# Patient Record
Sex: Female | Born: 1990 | Race: White | Hispanic: No | Marital: Single | Smoking: Never smoker
Health system: Southern US, Community
[De-identification: ages and names within clinical notes are randomized; demographics above are authoritative.]

## PROBLEM LIST (undated history)

## (undated) ENCOUNTER — Emergency Department (HOSPITAL_COMMUNITY): Admission: EM | Payer: Medicaid Other

## (undated) DIAGNOSIS — J45909 Unspecified asthma, uncomplicated: Secondary | ICD-10-CM

---
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
## 2013-11-09 ENCOUNTER — Ambulatory Visit: Payer: Self-pay | Admitting: Podiatry

## 2016-03-22 NOTE — L&D Delivery Note (Signed)
Delivery Note Pt labored down while I was in another delivery. She then pushed for 30mins and at 7:20 AM a viable female was delivered via Vaginal, Spontaneous (Presentation: LOA ;  ).  APGAR: 9, 9; weight pending  .   Placenta status:delivered intact shultz , .  Cord: 3vc with the following complications:none .    Anesthesia:  Epidural Episiotomy: None Lacerations: 1st degree;Perineal Suture Repair: 2.0 vicryl Est. Blood Loss (mL): 100  Mom to postpartum.  Baby to Couplet care / Skin to Skin. Circ to be done in office  Cathrine MusterCecilia W Trameka Dorough 03/13/2017, 7:41 AM

## 2016-10-08 LAB — OB RESULTS CONSOLE HEPATITIS B SURFACE ANTIGEN: Hepatitis B Surface Ag: NEGATIVE

## 2016-10-08 LAB — OB RESULTS CONSOLE GC/CHLAMYDIA: Gonorrhea: NEGATIVE

## 2016-10-08 LAB — OB RESULTS CONSOLE RUBELLA ANTIBODY, IGM: Rubella: IMMUNE

## 2016-10-08 LAB — OB RESULTS CONSOLE VARICELLA ZOSTER ANTIBODY, IGG: Varicella: IMMUNE

## 2016-10-08 LAB — OB RESULTS CONSOLE HIV ANTIBODY (ROUTINE TESTING): HIV: NONREACTIVE

## 2016-10-11 LAB — OB RESULTS CONSOLE ABO/RH: "RH Type ": NEGATIVE

## 2016-10-11 LAB — OB RESULTS CONSOLE GC/CHLAMYDIA: Chlamydia: NEGATIVE

## 2016-10-11 LAB — OB RESULTS CONSOLE ANTIBODY SCREEN
Antibody Screen: NEGATIVE
Antibody Screen: NEGATIVE

## 2016-12-28 ENCOUNTER — Ambulatory Visit: Payer: Medicaid Other | Attending: Obstetrics and Gynecology | Admitting: Physical Therapy

## 2016-12-28 ENCOUNTER — Encounter: Payer: Self-pay | Admitting: Physical Therapy

## 2016-12-28 DIAGNOSIS — O26892 Other specified pregnancy related conditions, second trimester: Secondary | ICD-10-CM

## 2016-12-28 DIAGNOSIS — R102 Pelvic and perineal pain: Secondary | ICD-10-CM | POA: Insufficient documentation

## 2016-12-28 DIAGNOSIS — M545 Low back pain, unspecified: Secondary | ICD-10-CM

## 2016-12-28 NOTE — Therapy (Addendum)
[place], [place], [address] Phone: [phone]   Fax:  [phone]  Physical Therapy Evaluation      Addended Discharge  Patient Details  Name: Sonya Jackson MRN: [id number] Date of Birth: [birth year] Referring Provider: Dr. Crawford Givens  Encounter Date: 12/28/2016      PT End of Session - 12/28/16 1618    Visit Number 1   Number of Visits 4   Date for PT Re-Evaluation 01/25/17   PT Start Time 0758   PT Stop Time 0845   PT Time Calculation (min) 47 min   Activity Tolerance Patient tolerated treatment well   Behavior During Therapy [facility] for tasks assessed/performed      History reviewed. No pertinent past medical history.  History reviewed. No pertinent surgical history.  There were no vitals filed for this visit.       Subjective Assessment - 12/28/16 0803    Subjective Patient is 6 mos pregnant and has low back/pelvic pain. She reports pain in her back prior to pregnancy ("pinched nerve"). She now has pelvic pain, difficulty lifting her leg in a standing and sitting position.   Pain with transitional movements. No red flags.  She denies back pain currently.  She has pain with walking, feels like her "bones are being pulled apart"   No tingling in her legs or perineals.  No weakness.  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    Pertinent History low back pain    Limitations Sitting;Lifting;Standing;Walking;House hold activities;Other (comment)  dancing, teaching, sleeping    How long can you sit comfortably? not long at all    How long can you stand comfortably? not ever comfortable   How long can you walk comfortably? not long    Diagnostic tests None    Patient Stated Goals Pt would like to not hurt as bad.    Currently in Pain? Yes   Pain Score 7    Pain Location Pelvis   Pain Orientation Anterior   Pain Descriptors / Indicators  Burning;Pressure   Pain Type Chronic pain   Pain Radiating Towards no   Pain Onset More than a month ago   Pain Frequency Intermittent   Aggravating Factors  hip flexion in standing and sitting.     Pain Relieving Factors rest, laying flat, ice    Effect of Pain on Daily Activities pain limits comfort and activity, sleep   Multiple Pain Sites No            [facility] PT Assessment - 12/28/16 0001      Assessment   Medical Diagnosis low back pain    Referring Provider Dr. 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 , has a Insurance underwriter   Overall  Cognitive Status Within Functional Limits for tasks assessed     Observation/Other Assessments   Focus on Therapeutic Outcomes (FOTO)  NT      Sensation   Light Touch Appears Intact     Functional Tests   Functional tests Single leg stance     Single Leg Stance   Comments painful but able to do >15 sec      Posture/Postural Control   Posture/Postural Control Postural limitations   Postural Limitations Anterior pelvic tilt     AROM   Overall AROM Comments Lumbar/trunk WFL , hip flexion WFL, pain with active ER and IR of hips     PROM   Overall PROM Comments pain with passive hip ER and IR      Strength   Overall Strength Comments WFL in bilateral hips      Flexibility   Hamstrings WNL 80 deg mild pain in lumbar bilaterally.      Palpation   SI assessment  pain anteriorly with SIJ compression in sidelying    Palpation comment  landmarks level pubic ramus and ASIS, PSIS     Transfers   Comments pain with supine to sit anf sit to supine      Ambulation/Gait   Ambulation Distance (Feet) 150 Feet   Assistive device Other (Comment)   Gait Pattern Within Functional Limits  mild antalgic gait             Objective measurements completed on examination: See above findings.          [facility] Adult PT Treatment/Exercise - 12/28/16 0001      Self-Care   Self-Care Posture;Heat/Ice Application;Other Self-Care Comments   Posture sitting on therapy ball,    Other Self-Care Comments  pregnancy hormones, anatomy of pelvis, HEP and core , body mechanics      Lumbar Exercises: Seated   Other Seated Lumbar Exercises Transverse Abdominus x 10      Lumbar Exercises: Quadruped   Madcat/Old Horse 10 reps   Other Quadruped Lumbar Exercises childs pose wide legs, lateral flexion ("wagging")                   PT Short Term Goals - 12/28/16 1541      PT SHORT TERM GOAL #1   Title Pt will be I with HEP for spinal mobility and pelvic floor/core.    Baseline unknown, given on eval    Time 4   Period Weeks  3 visits   Status New   Target Date 01/25/17     PT SHORT TERM GOAL #2   Title Pt will be able to sit and lift leg for lower body dressing with min discomfort    Baseline mod to severe pain    Time 4  3 visits   Period Weeks   Target Date 01/25/17     PT SHORT TERM GOAL #3   Title Pt will be able to walk with 25% less pain, difficulty.    Baseline pain usually 6/10-8/10    Time 4  3 visit    Period Weeks   Status New   Target Date 01/25/17     PT SHORT TERM GOAL #4   Title Pt will be I with modifications of ADLs to reduce pain in pelvis, low back , use good body mechanics consistently.    Baseline needs cues, reinforced avoiding aggravating factors, lifting properly.    Time 4  3 visits    Period Weeks   Status  New   Target Date 01/25/17           PT Long Term Goals - 12/28/16  1538      PT LONG TERM GOAL #1   Title TBA if progress made                Plan - 12/28/16 1547    Clinical Impression Statement Patient presents with pelvic floor pain and a history of low back pain, chronic.  She has pelvic pain which limits simple ADLs.  I have contacted our pelvic pain specialist as I believe she may have the need for manual therapy.  Bony landmarks seemed symmetrical to me on eval. She was given quadruped exercises and pelvic floor isometrics.  She may benefit from a SI belt as well and she had already ordered it.    Clinical Presentation Stable   Clinical Presentation due to: increasing pain    Clinical Decision Making Low   Rehab Potential Excellent   PT Frequency 1x / week   PT Duration 4 weeks  3 visits    PT Treatment/Interventions ADLs/Self Care Home Management;Neuromuscular re-education;Manual techniques;Therapeutic exercise;Cryotherapy;Therapeutic activities;Functional mobility training;Patient/family education;Moist Heat   PT Next Visit Plan ?Refer to pelvic pain specialist? review HEP and pelvic /spinal mobility, SI belt? tape    PT Home Exercise Plan quadruped, iso TrA    Consulted and Agree with Plan of Care Patient      Patient will benefit from skilled therapeutic intervention in order to improve the following deficits and impairments:  Decreased range of motion, Decreased mobility, Postural dysfunction, Pain, Improper body mechanics, Increased fascial restricitons  Visit Diagnosis: Pelvic pain affecting pregnancy in second trimester, antepartum  Bilateral low back pain without sciatica, unspecified chronicity     Problem List There are no active problems to display for this patient.   Katonya Blecher 12/28/2016, 4:28 PM  [facility] [address] [place], [place], [address] Phone: [phone]   Fax:  [phone]  Name: Sonya Jackson MRN: [id number] Date of Birth: [birth year]    Raeford Razor, PT 12/28/16 4:29 PM Phone: [phone] Fax: [phone]   PHYSICAL THERAPY DISCHARGE SUMMARY  Visits from Start of Care: 1  Current functional level related to goals / functional outcomes: Unknown, see above    Remaining deficits: Unknown   Education / Equipment: HEP, pelvic floor pain specialist  Plan: Patient agrees to discharge.  Patient goals were not met. Patient is being discharged due to the patient's request.  ?????    Referred out to pelvic pain specialist , appt pending.   Raeford Razor, PT 01/26/17 9:59 AM Phone: [phone] Fax: [phone]

## 2017-01-11 ENCOUNTER — Ambulatory Visit: Payer: Medicaid Other | Admitting: Physical Therapy

## 2017-01-24 ENCOUNTER — Ambulatory Visit: Payer: Medicaid Other | Admitting: Physical Therapy

## 2017-02-01 ENCOUNTER — Ambulatory Visit: Payer: Medicaid Other | Attending: Obstetrics and Gynecology | Admitting: Physical Therapy

## 2017-02-01 ENCOUNTER — Encounter: Payer: Self-pay | Admitting: Physical Therapy

## 2017-02-01 DIAGNOSIS — O26892 Other specified pregnancy related conditions, second trimester: Secondary | ICD-10-CM

## 2017-02-01 DIAGNOSIS — R102 Pelvic and perineal pain: Secondary | ICD-10-CM | POA: Insufficient documentation

## 2017-02-01 DIAGNOSIS — M545 Low back pain, unspecified: Secondary | ICD-10-CM

## 2017-02-01 NOTE — Therapy (Signed)
[facility] [address], [address] [place], [place], [address] Phone: [phone]   Fax:  [phone]  Physical Therapy Treatment  Patient Details  Name: Sonya Jackson MRN: [id number] Date of Birth: 1990-04-01 Referring Provider: Dr. Doristine Counter   Encounter Date: 02/01/2017  PT End of Session - 02/01/17 1438    Visit Number  2    Date for PT Re-Evaluation  03/10/17    Authorization Type  Medicaid 8 visits from 01/11/2017-02/10/2017    Authorization - Visit Number  2    Authorization - Number of Visits  8    PT Start Time  1145    PT Stop Time  1225    PT Time Calculation (min)  40 min    Activity Tolerance  Patient tolerated treatment well    Behavior During Therapy  [facility] for tasks assessed/performed       History reviewed. No pertinent past medical history.  History reviewed. No pertinent surgical history.  There were no vitals filed for this visit.  Subjective Assessment - 02/01/17 1150    Subjective  Patient is 6 mos pregnant and has low back/pelvic pain. She reports pain in her back prior to pregnancy ("pinched nerve"). She now has pelvic pain, difficulty lifting her leg in a standing and sitting position.   Pain with transitional movements. No red flags.  She denies back pain currently.  She has pain with walking, feels like her "bones are being pulled apart"   No tingling in her legs or perineals.  No weakness.  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     Pertinent History  low back pain     Limitations  Sitting;Lifting;Standing;Walking;House hold activities;Other (comment)    How long can you sit comfortably?  not long at all     How long can you stand comfortably?  not ever comfortable    How long can you walk comfortably?  not long     Diagnostic tests  None     Patient Stated Goals  Pt would like to not hurt as bad.     Currently in Pain?  Yes    Pain  Score  8     Pain Location  Pelvis    Pain Orientation  Anterior    Pain Descriptors / Indicators  Burning;Pressure    Pain Type  Chronic pain    Pain Onset  More than a month ago    Pain Frequency  Intermittent    Aggravating Factors   lay down and turn self over, hip flexion in standing and sitting    Pain Relieving Factors  rest, laying flat, ice    Multiple Pain Sites  No         [facility] PT Assessment - 02/01/17 0001      Assessment   Medical Diagnosis  low back pain     Onset Date/Surgical Date  -- 6 mos    Next MD Visit  no     Prior Therapy  No       Precautions   Precautions  Other (comment)    Precaution Comments  no biofreeze, [redacted] weeks pregnant      Restrictions   Weight Bearing Restrictions  No      Balance Screen   Has the patient fallen in the past 6 months  No      Home Environment   Living Environment  Private  residence    Living Arrangements  Parent    Home Access  Level entry    Home Layout  One level    Additional Comments  lives with parents,engaged      Prior Function   Vocation  Part time employment    Therapist, sportsVocation Requirements  dance teacher , has a Administrator, 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       Cognition   Overall Cognitive Status  Within Functional Limits for tasks assessed      Observation/Other Assessments   Focus on Therapeutic Outcomes (FOTO)   NT       Sensation   Light Touch  Appears Intact      Functional Tests   Functional tests  Single leg stance      Single Leg Stance   Comments  painful but able to do >15 sec       Posture/Postural Control   Posture/Postural Control  Postural limitations    Postural Limitations  Anterior pelvic tilt      Palpation   SI assessment   pain anteriorly with SIJ compression in sidelying                   [facility] Adult PT Treatment/Exercise - 02/01/17 0001      Self-Care   Self-Care  Other Self-Care Comments    Other Self-Care Comments   brace to use to assist in pelvic pressure      Therapeutic Activites     Therapeutic Activities  ADL's    ADL's  rolling bed with log roll and pelvic floor contraction, laying in bed with pillows, breathing out during strenous activity      Lumbar Exercises: Quadruped   Madcat/Old Horse  10 reps      Manual Therapy   Manual Therapy  Muscle Energy Technique    Muscle Energy Technique  correct right ilium in sidely             PT Education - 02/01/17 1219    Education provided  Yes    Education Details  how to use ball to massage pelvic floor muscles    Person(s) Educated  Patient    Methods  Explanation;Demonstration;Verbal cues;Handout    Comprehension  Verbalized understanding;Returned demonstration       PT Short Term Goals - 02/01/17 1237      PT SHORT TERM GOAL #1   Title  Pt will be I with HEP for spinal mobility and pelvic floor/core.     Baseline  unknown, given on eval     Time  4    Period  Weeks 3 visits    Status  On-going      PT SHORT TERM GOAL #2   Title  Pt will be able to sit and lift leg for lower body dressing with min discomfort     Time  4 3 visits    Period  Weeks    Status  New      PT SHORT TERM GOAL #3   Title  Pt will be able to walk with 25% less pain, difficulty.     Baseline  pain usually 6/10-8/10     Time  4 3 visits    Period  Weeks    Status  New      PT SHORT TERM GOAL #4   Title  Pt will be I with modifications of ADLs to reduce pain in pelvis, low back , use good body mechanics consistently.     Baseline  needs  cues, reinforced avoiding aggravating factors, lifting properly.     Time  4 3 visits    Period  Weeks    Status  New        PT Long Term Goals - 02/01/17 1318      PT LONG TERM GOAL #1   Title  Pt will be able to sit and lift leg for lower body dressing with min discomfort     Baseline  pain level 6-8/10    Time  4    Period  Weeks    Status  New    Target Date  03/10/17      PT LONG TERM GOAL #2   Title  turn in bed with minimal pain due to abdominal bracing and correct body  mechanics    Baseline  pain level 6-8/10    Time  4    Period  Weeks    Status  New    Target Date  03/10/17      PT LONG TERM GOAL #3   Title  pelvic pressure reduced  minimal due to increased tone of pelvic floor muscles and using a pregnancy brace    Baseline  moderate to max depending on position    Time  4    Period  Weeks    Status  New    Target Date  03/10/17            Plan - 02/01/17 1439    Clinical Impression Statement  Patient has only attended the initial evaluation due to her schedule.  Patient continues to have pelvic pressure with standing, walking, daily activities.  She has 8/10 intermittent back pain.  Patient right ilium is anteriorly rotated.  Patient has difficulty with moving in bed and going to sit to stand with increased pain and pressure of the back and perineum.  Patient is having difficulty with her blood pressure and being monitored weekly.  Patient was instructed on SI belt that will support the perineum to reduce the pelvic pressure.  Patient understands how to contract the pelvic floor muscles to work on the support and reduce pressure. Patient will benefit from skilled therapy to reduce pelvic pressure and decrease pain so she is able to perform daily activities in her last trimester of her pregnancy.     Rehab Potential  Excellent    Clinical Impairments Affecting Rehab Potential  [redacted] weeks pregnant    PT Frequency  1x / week    PT Duration  4 weeks    PT Treatment/Interventions  ADLs/Self Care Home Management;Neuromuscular re-education;Manual techniques;Therapeutic exercise;Cryotherapy;Therapeutic activities;Functional mobility training;Patient/family education;Moist Heat    PT Next Visit Plan  soft tissue work to perineum, lumbar soft tissue work, correct pelvis, core strength, ask about the belt    PT Home Exercise Plan  progress as needed    Recommended Other Services  renewal sent    Consulted and Agree with Plan of Care  Patient        Patient will benefit from skilled therapeutic intervention in order to improve the following deficits and impairments:  Decreased range of motion, Decreased mobility, Postural dysfunction, Pain, Improper body mechanics, Increased fascial restricitons  Visit Diagnosis: Pelvic pain affecting pregnancy in second trimester, antepartum - Plan: PT plan of care cert/re-cert  Bilateral low back pain without sciatica, unspecified chronicity - Plan: PT plan of care cert/re-cert     Problem List There are no active problems to display for this patient.   Eulis Fosterheryl Jacinto Keil,  PT 02/01/17 2:48 PM   [facility] [address], [address] [place], [place], [address] Phone: [phone]   Fax:  [phone]  Name: MECKENZIE BALSLEY MRN: [id number] Date of Birth: 17-Apr-1990

## 2017-02-01 NOTE — Patient Instructions (Addendum)
Slow Contraction: Gravity Eliminated (Side-Lying)    Lie on left side, hips and knees slightly bent. Slowly squeeze pelvic floor for _5__ seconds. Rest for _10__ seconds. Repeat _10__ times. Do _3__ times a day.   Copyright  VHI. All rights reserved.   Quick Contraction: Gravity Eliminated (Side-Lying)    Lie on left side, hips and knees slightly bent. Quickly squeeze then fully relax pelvic floor. Perform __1_ sets of __5_. Rest for __1_ seconds between sets. Do _3__ times a day.   Copyright  VHI. All rights reserved.  PELVIC PRESSURE: Bound Angle    Sit with soles of feet together. Hold feet or ankles. Press knees toward floor and stretch up through spine. Lean forward and Hold position for __5_ breaths. Repeat _5__ times. Do _2__ times per day.  Copyright  VHI. All rights reserved.  PELVIC PRESSURE: Spinal Twist (Sitting)    Place right foot over left leg. Hold right foot with right hand, place left hand behind buttocks. Inhaling, straighten spine. Exhaling, rotate torso to left. Hold position for _10__ breaths. Switch legs and repeat on other side. Repeat _1__ times, each side. Do __1_ times per day.  Copyright  VHI. All rights reserved.    BACK: Stretch on [employer] CorporationWall    Lean against wall with feet about _6__ inches away from wall. Keep knees straight or slightly bent. Exhaling, bring chin toward chest and slowly peel your back off of the wall. Inhaling, tighten abdomen and slowly bring upper body back onto wall. Repeat _2__ times. Do _2__ times per day.  Copyright  VHI. All rights reserved.  BACK / HAMSTRING: Stretch on Wall    Place hands shoulder-width apart on wall and move hips backward until hips, shoulders, and hands align. Exhaling, contract quadriceps and lean hips away from wall. Bend knees if discomfort is felt in low back. Hold position for _10__ breaths. Tighten abdomen and roll up to standing. Repeat _3__ times. Do __2_ times per day.  Copyright   VHI. All rights reserved.  Kegel    Exhaling, squeeze pelvic floor muscles as if to hold something in the vaginal opening. Inhaling, release. Can be done sitting, standing, side-lying, or knee-chest. Practice in a position that is comfortable and switch positions for variety. Repeat __10_ times. Do __3_ times per day.  Copyright  VHI. All rights reserved.  [facility] [address], [address] [place], [place] [address] Phone # [phone] Fax [phone]

## 2017-02-08 ENCOUNTER — Ambulatory Visit: Payer: Medicaid Other | Admitting: Physical Therapy

## 2017-02-08 ENCOUNTER — Encounter: Payer: Self-pay | Admitting: Physical Therapy

## 2017-02-08 ENCOUNTER — Encounter: Payer: Medicaid Other | Admitting: Physical Therapy

## 2017-02-08 DIAGNOSIS — O26892 Other specified pregnancy related conditions, second trimester: Secondary | ICD-10-CM

## 2017-02-08 DIAGNOSIS — R102 Pelvic and perineal pain: Principal | ICD-10-CM

## 2017-02-08 DIAGNOSIS — M545 Low back pain, unspecified: Secondary | ICD-10-CM

## 2017-02-08 NOTE — Patient Instructions (Addendum)
Isometric Hold (Hook-Lying)    Lie with hips and knees bent. Slowly inhale, and then exhale. Pull navel toward spine and Hold for __5_ seconds. Continue to breathe in and out during hold. Rest for __5_ seconds. Repeat __5_ times. Do _2__ times a day. Do in sidely.   Do after the baby if vaginal birth. ( on back) Copyright  VHI. All rights reserved.   Clam Shell 45 Degrees    Lying with hips and knees bent 45, one pillow between knees and ankles. Lift knee. Be sure pelvis does not roll backward. Do not arch back. Do _10__ times, each leg, _1__ times per day. Both sides.   http://ss.exer.us/75   Copyright  VHI. All rights reserved.    BACK: Child's Pose (Sciatica)    Sit in knee-chest position and reach arms forward. Separate knees for comfort. Hold position for _30__ breaths. Repeat __2_ times. Do __1_ times per day.  Copyright  VHI. All rights reserved.  BACK: Rounded Cat Stretch    Exhaling, pull abdomen toward spine, tilt hips forward, and bring chin toward chest rounding the spine. Hold position for _1__ breaths. Inhaling, straighten back. Repeat __15_ times. Do _1__ times per day.  Copyright  VHI. All rights reserved.  PELVIC PRESSURE / LOW BACK: Swan    With hands on floor, shoulder-width apart, bring right knee forward so that knee is facing to the right and sole of right foot is facing left. Extend left leg back. Tighten abdominal muscles to stabilize the spine. Hold position for _30__ breaths. Exhaling, switch legs and repeat. Repeat _2__ times, each side. Do __1_ times per day.  Copyright  VHI. All rights reserved.  Adduction: Hip - Knees Together With Pelvic Floor (Sitting)    Sit with towel roll between knees. Squeeze pelvic floor while pushing knees together. Hold for ___ seconds. Rest for _10__ seconds. Repeat _5__ times. Do _2-3__ times a day.  Copyright  VHI. All rights reserved.  [facility] [address], [address] [place], [place] [address] Phone # [phone] Fax [phone]

## 2017-02-08 NOTE — Therapy (Signed)
[facility] [address] [place], [place], [address] Phone: [phone]   Fax:  [phone]  Physical Therapy Treatment  Patient Details  Name: Sonya Jackson MRN: [id number] Date of Birth: 1990/09/19 Referring Provider: Dr. Crawford Givens   Encounter Date: 02/08/2017  PT End of Session - 02/08/17 0934    Visit Number  3    Date for PT Re-Evaluation  03/12/17    Authorization Type  Medicaid 02/11/2017-03/12/2017 3 visits     Authorization - Visit Number  3    Authorization - Number of Visits  6    PT Start Time  0930    PT Stop Time  1010    PT Time Calculation (min)  40 min    Activity Tolerance  Patient tolerated treatment well    Behavior During Therapy  [facility] for tasks assessed/performed       History reviewed. No pertinent past medical history.  History reviewed. No pertinent surgical history.  There were no vitals filed for this visit.  Subjective Assessment - 02/08/17 0936    Subjective  No changes since last visit. I feel like I pinched something in my back.  I have trouble moving my left leg. I ordered the SI belt.     Pertinent History  low back pain     Limitations  Sitting;Lifting;Standing;Walking;House hold activities;Other (comment)    How long can you sit comfortably?  not long at all     How long can you stand comfortably?  not ever comfortable    How long can you walk comfortably?  not long     Diagnostic tests  None     Patient Stated Goals  Pt would like to not hurt as bad.     Currently in Pain?  Yes    Pain Score  5     Pain Location  Pelvis    Pain Orientation  Anterior    Pain Descriptors / Indicators  Burning;Pressure    Pain Type  Chronic pain    Pain Onset  More than a month ago    Pain Frequency  Intermittent    Aggravating Factors   lay down and turn self over, hip flexion in standing and sitting    Pain Relieving Factors  rest, laying flat, ice         [facility] PT  Assessment - 02/08/17 0001      Palpation   SI assessment   left ilium is rotated posteriorly                  [facility] Adult PT Treatment/Exercise - 02/08/17 0001      Therapeutic Activites    Therapeutic Activities  Other Therapeutic Activities    Other Therapeutic Activities  rolling in bed, sit to stand and supine to sit with abdominal bracing and pelvic floor contraction      Lumbar Exercises: Seated   Other Seated Lumbar Exercises  sitting hip adduction with pillow and pelvic floor contration      Manual Therapy   Manual Therapy  Muscle Energy Technique;Joint mobilization;Soft tissue mobilization    Joint Mobilization  gapping of the left facet of L4-S1    Soft tissue mobilization  to bil. coccygeus in quadruped going into child pose    Muscle Energy Technique  correct left ilium             PT Education - 02/08/17 1015    Education provided  Yes  Education Details  abdominal bracing with pelvic floor contraction in sitting and sidely and supine, transitional movements with abdominal bracing and pelvic floor contraciton, stretches    Person(s) Educated  Patient    Methods  Explanation;Demonstration;Verbal cues;Handout    Comprehension  Verbalized understanding;Returned demonstration       PT Short Term Goals - 02/01/17 1237      PT SHORT TERM GOAL #1   Title  Pt will be I with HEP for spinal mobility and pelvic floor/core.     Baseline  unknown, given on eval     Time  4    Period  Weeks 3 visits    Status  On-going      PT SHORT TERM GOAL #2   Title  Pt will be able to sit and lift leg for lower body dressing with min discomfort     Time  4 3 visits    Period  Weeks    Status  New      PT SHORT TERM GOAL #3   Title  Pt will be able to walk with 25% less pain, difficulty.     Baseline  pain usually 6/10-8/10     Time  4 3 visits    Period  Weeks    Status  New      PT SHORT TERM GOAL #4   Title  Pt will be I with modifications of ADLs to  reduce pain in pelvis, low back , use good body mechanics consistently.     Baseline  needs cues, reinforced avoiding aggravating factors, lifting properly.     Time  4 3 visits    Period  Weeks    Status  New        PT Long Term Goals - 02/01/17 1318      PT LONG TERM GOAL #1   Title  Pt will be able to sit and lift leg for lower body dressing with min discomfort     Baseline  pain level 6-8/10    Time  4    Period  Weeks    Status  New    Target Date  03/10/17      PT LONG TERM GOAL #2   Title  turn in bed with minimal pain due to abdominal bracing and correct body mechanics    Baseline  pain level 6-8/10    Time  4    Period  Weeks    Status  New    Target Date  03/10/17      PT LONG TERM GOAL #3   Title  pelvic pressure reduced  minimal due to increased tone of pelvic floor muscles and using a pregnancy brace    Baseline  moderate to max depending on position    Time  4    Period  Weeks    Status  New    Target Date  03/10/17            Plan - 02/08/17 1017    Clinical Impression Statement  After therapy patient pelvis was in correct alignment and reduction in pelvic pressure. Patient pelvis in correct alignment.  Her L5-S1 facet was tight and after therapy had full movement.  Patient had trigger points in the right coccygeus making pain  going from sit to stand and after therapy no pain.  Patient has bough a SI belt and is waiting for it to come in. Patient has not met goals due to just having 3  visits.  Patient will benefit from skilled therapy to decrease pain and pelvic pressure so she is able to perform daily activities in her last trimester of her pregnanacy.     Rehab Potential  Excellent    Clinical Impairments Affecting Rehab Potential  [redacted] weeks pregnant    PT Frequency  1x / week    PT Duration  4 weeks    PT Treatment/Interventions  ADLs/Self Care Home Management;Neuromuscular re-education;Manual techniques;Therapeutic exercise;Cryotherapy;Therapeutic  activities;Functional mobility training;Patient/family education;Moist Heat    PT Next Visit Plan  soft tissue work to perineum, lumbar soft tissue work, correct pelvis, core strength, ask about the belt    PT Home Exercise Plan  progress as needed    Recommended Other Services  MD signed renewal note    Consulted and Agree with Plan of Care  Patient       Patient will benefit from skilled therapeutic intervention in order to improve the following deficits and impairments:  Decreased range of motion, Decreased mobility, Postural dysfunction, Pain, Improper body mechanics, Increased fascial restricitons  Visit Diagnosis: Pelvic pain affecting pregnancy in second trimester, antepartum  Bilateral low back pain without sciatica, unspecified chronicity     Problem List There are no active problems to display for this patient.   Earlie Counts, PT 02/08/17 10:21 AM   [facility] [address] [place], [place], [address] Phone: [phone]   Fax:  [phone]  Name: Sonya Jackson MRN: [id number] Date of Birth: [birth year]

## 2017-02-22 ENCOUNTER — Encounter: Payer: Medicaid Other | Admitting: Physical Therapy

## 2017-03-01 ENCOUNTER — Encounter: Payer: Medicaid Other | Admitting: Physical Therapy

## 2017-03-04 LAB — OB RESULTS CONSOLE GBS: STREP GROUP B AG: NEGATIVE

## 2017-03-08 ENCOUNTER — Ambulatory Visit: Payer: Medicaid Other | Attending: Obstetrics and Gynecology | Admitting: Physical Therapy

## 2017-03-08 ENCOUNTER — Encounter: Payer: Self-pay | Admitting: Physical Therapy

## 2017-03-08 DIAGNOSIS — R102 Pelvic and perineal pain: Secondary | ICD-10-CM | POA: Diagnosis present

## 2017-03-08 DIAGNOSIS — M545 Low back pain, unspecified: Secondary | ICD-10-CM

## 2017-03-08 DIAGNOSIS — O26892 Other specified pregnancy related conditions, second trimester: Secondary | ICD-10-CM

## 2017-03-08 NOTE — Patient Instructions (Addendum)
Wall Lean Stretch    With right elbow against wall, slowly stretch hips toward wall, other arm supporting trunk. Hold _till pain in center of back___ seconds. Relax. Repeat __1__ times per set. Do __1__ sets per session. Do ___3_ sessions per day. Should be bringing pain to the center of you back http://orth.exer.us/102   Copyright  VHI. All rights reserved.    Cryotherapy WHAT IS CRYOTHERAPY? Cryotherapy, or cold therapy, is a treatment that uses cold temperatures to treat an injury or medical condition. It includes using cold packs or ice packs to reduce pain and swelling. Only use cryotherapy if your doctor says it is okay. HOW DO I USE CRYOTHERAPY?  Place a towel between the cold source and your skin.  Apply the cold source for no more than 20 minutes at a time.  Check your skin after 5 minutes to make sure there are no signs of a poor response to cold or skin damage. Check for: ? White spots on your skin. Your skin may look blotchy or mottled. ? Skin that looks blue or pale. ? Skin that feels waxy or hard.  Repeat these steps as many times each day as told by your doctor.  HOW CAN I MAKE A COLD PACK? When using a cold pack at home to reduce pain and swelling, you can use:  A silica gel cold pack that has been left in the freezer. You can buy this online or in stores.  A plastic bag of frozen vegetables.  A sealable plastic bag that has been filled with crushed ice.  Always wrap the pack in a dry or damp towel to avoid direct contact with your skin. WHEN SHOULD I CALL MY DOCTOR? Call your doctor if:  You start to have white spots on your skin. This may give your skin a blotchy or mottled look.  Your skin turns blue or pale.  Your skin becomes waxy or hard.  Your swelling gets worse.  This information is not intended to replace advice given to you by your health care provider. Make sure you discuss any questions you have with your health care provider. Document  Released: 08/25/2007 Document Revised: 08/14/2015 Document Reviewed: 11/20/2014 Elsevier Interactive Patient Education  2017 Elsevier Inc. Piriformis Stretch, Sitting    Sit, one ankle on opposite knee, same-side hand on crossed knee. Push down on knee, keeping spine straight. Lean torso forward, with flat back, until tension is felt in hamstrings and gluteals of crossed-leg side. Hold _30__ seconds.  Repeat _2__ times per session. Do __1_ sessions per day.  Copyright  VHI. All rights reserved.  Use ball during birth  [facility] [address], [address] [place], [place] [address] Phone # [phone] Fax [phone]

## 2017-03-08 NOTE — Therapy (Signed)
[facility] [address], [place], [place], [address] Phone: [phone]   Fax:  [phone]  Physical Therapy Treatment  Patient Details  Name: Sonya Jackson MRN: [id number] Date of Birth: [birth year] Referring Provider: Dr. Crawford Givens   Encounter Date: 03/08/2017  PT End of Session - 03/08/17 1010    Visit Number  4    Date for PT Re-Evaluation  03/12/17    Authorization Type  Medicaid 02/11/2017-03/12/2017 3 visits     Authorization - Visit Number  4    Authorization - Number of Visits  6    PT Start Time  0930    PT Stop Time  1008    PT Time Calculation (min)  38 min    Activity Tolerance  Patient tolerated treatment well    Behavior During Therapy  [facility] for tasks assessed/performed       History reviewed. No pertinent past medical history.  History reviewed. No pertinent surgical history.  There were no vitals filed for this visit.  Subjective Assessment - 03/08/17 0936    Subjective  I will be 37 weeks on Saturday. Patient reports her pelvic pain decreased by 25%. I have increased pain with being on my feet alot.     Pertinent History  low back pain     Limitations  Sitting;Lifting;Standing;Walking;House hold activities;Other (comment)    How long can you sit comfortably?  not long at all     How long can you stand comfortably?  not ever comfortable    How long can you walk comfortably?  not long     Diagnostic tests  None     Patient Stated Goals  Pt would like to not hurt as bad.     Currently in Pain?  Yes    Pain Location  Pelvis    Pain Orientation  Anterior    Pain Descriptors / Indicators  Burning;Pressure    Pain Type  Chronic pain    Pain Onset  More than a month ago    Pain Frequency  Intermittent    Aggravating Factors   being on feet, bridging    Pain Relieving Factors  being off her feet    Multiple Pain Sites  Yes    Pain Score  6    Pain Location  Back    Pain Orientation   Lower;Left    Pain Descriptors / Indicators  -- pinched nerve    Pain Type  Acute pain    Pain Onset  More than a month ago    Pain Frequency  Intermittent    Aggravating Factors   sitting a long period and gets up then has trouble moving left leg    Pain Relieving Factors  unable to          [facility] PT Assessment - 03/08/17 0001      Assessment   Medical Diagnosis  low back pain     Referring Provider  Dr. Crawford Givens    Onset Date/Surgical Date  -- 6 mos    Next MD Visit  no     Prior Therapy  No       Precautions   Precautions  Other (comment)    Precaution Comments  no biofreeze, [redacted] weeks pregnant      Restrictions   Weight Bearing Restrictions  No      Home Environment   Living Environment  Private residence    Living Arrangements  Parent  Home Access  Level entry    Home Layout  One level    Additional Comments  lives with parents,engaged      Prior Function   Vocation  Part time employment    Air cabin crew , has a Publishing rights manager   Overall Cognitive Status  Within Functional Limits for tasks assessed      Observation/Other Assessments   Focus on Therapeutic Outcomes (FOTO)   NT       Sensation   Light Touch  Appears Intact      Functional Tests   Functional tests  Single leg stance      Single Leg Stance   Comments  painful but able to do >15 sec       Posture/Postural Control   Posture/Postural Control  Postural limitations    Postural Limitations  Anterior pelvic tilt pregnant posture      Palpation   SI assessment   pelvis in correct alignment                  [facility] Adult PT Treatment/Exercise - 03/08/17 0001      Self-Care   Self-Care  Other Self-Care Comments    Other Self-Care Comments   using a tennis ball to massage left lower back      Exercises   Exercises  Other Exercises    Other Exercises   ways to use physioball during labor to reduce back pain and  make her comfortable      Manual  Therapy   Manual Therapy  Soft tissue mobilization;Joint mobilization    Joint Mobilization  gapping of left L3-S1 in sidely    Soft tissue mobilization  left lumbar paraspinals             PT Education - 03/08/17 1009    Education provided  Yes    Education Details  stretches; ways to massage her back; ways to use physioball during birth    Person(s) Educated  Patient    Methods  Explanation;Handout;Demonstration    Comprehension  Verbalized understanding;Returned demonstration       PT Short Term Goals - 03/08/17 1014      PT SHORT TERM GOAL #1   Title  Pt will be I with HEP for spinal mobility and pelvic floor/core.     Time  4    Period  Weeks    Status  Achieved      PT SHORT TERM GOAL #2   Title  Pt will be able to sit and lift leg for lower body dressing with min discomfort     Time  4    Period  Weeks    Status  Achieved      PT SHORT TERM GOAL #3   Title  Pt will be able to walk with 25% less pain, difficulty.     Time  4    Period  Weeks    Status  Achieved      PT SHORT TERM GOAL #4   Title  Pt will be I with modifications of ADLs to reduce pain in pelvis, low back , use good body mechanics consistently.     Time  4    Period  Weeks    Status  Achieved        PT Long Term Goals - 03/08/17 1014      PT LONG TERM GOAL #1   Title  Pt will be  able to sit and lift leg for lower body dressing with min discomfort     Time  4    Period  Weeks    Status  Achieved      PT LONG TERM GOAL #2   Title  turn in bed with minimal pain due to abdominal bracing and correct body mechanics    Time  4    Period  Weeks    Status  Achieved      PT LONG TERM GOAL #3   Title  pelvic pressure reduced  minimal due to increased tone of pelvic floor muscles and using a pregnancy brace    Time  4    Period  Weeks    Status  Achieved            Plan - 03/08/17 1011    Clinical Impression Statement  Patient understands how to use the physioball to reduce back  pain during labor.  Patient had full mobility of lumbar spine and no pain after manual techniques.  Patient is [redacted] weeks pregnant.  Patient has increased pelvic pain when she stands too long or dances so she manages it with alternate sit and stand.  Patient has met goals and ready for discharge.     Rehab Potential  Excellent    Clinical Impairments Affecting Rehab Potential  [redacted] weeks pregnant    PT Treatment/Interventions  ADLs/Self Care Home Management;Neuromuscular re-education;Manual techniques;Therapeutic exercise;Cryotherapy;Therapeutic activities;Functional mobility training;Patient/family education;Moist Heat    PT Next Visit Plan  Discharge to HEP this visit    PT Home Exercise Plan  Current HEP    Consulted and Agree with Plan of Care  Patient       Patient will benefit from skilled therapeutic intervention in order to improve the following deficits and impairments:  Decreased range of motion, Decreased mobility, Postural dysfunction, Pain, Improper body mechanics, Increased fascial restricitons  Visit Diagnosis: Pelvic pain affecting pregnancy in second trimester, antepartum  Bilateral low back pain without sciatica, unspecified chronicity     Problem List There are no active problems to display for this patient.   Earlie Counts, PT 03/08/17 10:16 AM   [facility] [address] [place], [place], [address] Phone: [phone]   Fax:  [phone]  Name: Sonya Jackson MRN: [id number] Date of Birth: [birth year]  PHYSICAL THERAPY DISCHARGE SUMMARY  Visits from Start of Care: 4  Current functional level related to goals / functional outcomes: See above   Remaining deficits: See above   Education / Equipment: HEP Plan: Patient agrees to discharge.  Patient goals were met. Patient is being discharged due to meeting the stated rehab goals.  Thank you for the referral. Earlie Counts, PT 03/08/17 10:16  AM  ?????

## 2017-03-12 ENCOUNTER — Inpatient Hospital Stay (HOSPITAL_COMMUNITY): Payer: Medicaid Other | Admitting: Anesthesiology

## 2017-03-12 ENCOUNTER — Inpatient Hospital Stay (HOSPITAL_COMMUNITY)
Admit: 2017-03-12 | Discharge: 2017-03-15 | DRG: 806 | Disposition: A | Discharge status: Home / Self Care | Payer: Medicaid Other | Source: Ambulatory Visit | Attending: Obstetrics and Gynecology | Admitting: Obstetrics and Gynecology

## 2017-03-12 ENCOUNTER — Other Ambulatory Visit: Payer: Self-pay

## 2017-03-12 ENCOUNTER — Encounter (HOSPITAL_COMMUNITY): Payer: Self-pay

## 2017-03-12 DIAGNOSIS — O99214 Obesity complicating childbirth: Secondary | ICD-10-CM | POA: Diagnosis present

## 2017-03-12 DIAGNOSIS — O4103X1 Oligohydramnios, third trimester, fetus 1: Secondary | ICD-10-CM | POA: Diagnosis present

## 2017-03-12 DIAGNOSIS — Z3A37 37 weeks gestation of pregnancy: Secondary | ICD-10-CM

## 2017-03-12 DIAGNOSIS — O133 Gestational [pregnancy-induced] hypertension without significant proteinuria, third trimester: Secondary | ICD-10-CM | POA: Diagnosis present

## 2017-03-12 DIAGNOSIS — O4103X0 Oligohydramnios, third trimester, not applicable or unspecified: Secondary | ICD-10-CM | POA: Diagnosis present

## 2017-03-12 DIAGNOSIS — O134 Gestational [pregnancy-induced] hypertension without significant proteinuria, complicating childbirth: Secondary | ICD-10-CM | POA: Diagnosis present

## 2017-03-12 HISTORY — DX: Unspecified asthma, uncomplicated: J45.909

## 2017-03-12 LAB — CBC
HCT: 36.8 % (ref 36.0–46.0)
HEMATOCRIT: 36.3 % (ref 36.0–46.0)
Hemoglobin: 12.9 g/dL (ref 12.0–15.0)
Hemoglobin: 12.7 g/dL (ref 12.0–15.0)
MCH: 30.9 pg (ref 26.0–34.0)
MCH: 31 pg (ref 26.0–34.0)
MCHC: 35.1 g/dL (ref 30.0–36.0)
MCHC: 35 g/dL (ref 30.0–36.0)
MCV: 88 fL (ref 78.0–100.0)
MCV: 88.5 fL (ref 78.0–100.0)
PLATELETS: 237 10*3/uL (ref 150–400)
Platelets: 217 10*3/uL (ref 150–400)
RBC: 4.18 MIL/uL (ref 3.87–5.11)
RBC: 4.1 MIL/uL (ref 3.87–5.11)
RDW: 12.9 % (ref 11.5–15.5)
RDW: 13 % (ref 11.5–15.5)
WBC: 11.3 10*3/uL — ABNORMAL HIGH (ref 4.0–10.5)
WBC: 11.2 10*3/uL — ABNORMAL HIGH (ref 4.0–10.5)

## 2017-03-12 LAB — PROTEIN / CREATININE RATIO, URINE
CREATININE, URINE: 263 mg/dL
PROTEIN CREATININE RATIO: 0.06 mg/mg{creat} (ref 0.00–0.15)
TOTAL PROTEIN, URINE: 17 mg/dL

## 2017-03-12 LAB — URIC ACID: URIC ACID, SERUM: 6.2 mg/dL (ref 2.3–6.6)

## 2017-03-12 LAB — HEPATIC FUNCTION PANEL
ALBUMIN: 3 g/dL — AB (ref 3.5–5.0)
ALK PHOS: 90 U/L (ref 38–126)
ALT: 17 U/L (ref 14–54)
AST: 23 U/L (ref 15–41)
Bilirubin, Direct: <0.1 mg/dL — ABNORMAL LOW (ref 0.1–0.5)
TOTAL PROTEIN: 6.2 g/dL — AB (ref 6.5–8.1)
Total Bilirubin: 0.2 mg/dL — ABNORMAL LOW (ref 0.3–1.2)

## 2017-03-12 LAB — LACTATE DEHYDROGENASE: LDH: 208 U/L — AB (ref 98–192)

## 2017-03-12 LAB — RPR: RPR: NONREACTIVE

## 2017-03-12 MED ORDER — FENTANYL 2.5 MCG/ML BUPIVACAINE 1/10 % EPIDURAL INFUSION (WH - ANES)
14.0000 mL/h | INTRAMUSCULAR | Status: DC | PRN
  Administered 2017-03-12 – 2017-03-13 (×2): 14 mL/h via EPIDURAL
  Filled 2017-03-12 (×2): qty 100

## 2017-03-12 MED ORDER — LACTATED RINGERS IV SOLN
500.0000 mL | Freq: Once | INTRAVENOUS | Status: AC
  Administered 2017-03-12: 500 mL via INTRAVENOUS

## 2017-03-12 MED ORDER — OXYCODONE-ACETAMINOPHEN 5-325 MG PO TABS: 1.0000 | ORAL_TABLET | ORAL | Status: DC | PRN

## 2017-03-12 MED ORDER — MISOPROSTOL 25 MCG QUARTER TABLET
25.0000 ug | ORAL_TABLET | ORAL | Status: DC | PRN
  Administered 2017-03-12 (×3): 25 ug via VAGINAL
  Filled 2017-03-12 (×4): qty 1

## 2017-03-12 MED ORDER — LIDOCAINE HCL (PF) 1 % IJ SOLN
30.0000 mL | INTRAMUSCULAR | Status: DC | PRN
  Filled 2017-03-12: qty 30

## 2017-03-12 MED ORDER — BUTORPHANOL TARTRATE 1 MG/ML IJ SOLN
1.0000 mg | INTRAMUSCULAR | Status: DC | PRN
  Administered 2017-03-12 (×2): 1 mg via INTRAVENOUS
  Filled 2017-03-12 (×3): qty 1

## 2017-03-12 MED ORDER — ACETAMINOPHEN 325 MG PO TABS: 650.0000 mg | ORAL_TABLET | ORAL | Status: DC | PRN

## 2017-03-12 MED ORDER — OXYTOCIN 40 UNITS IN LACTATED RINGERS INFUSION - SIMPLE MED
2.5000 [IU]/h | INTRAVENOUS | Status: DC
  Filled 2017-03-12 (×2): qty 1000

## 2017-03-12 MED ORDER — ONDANSETRON HCL 4 MG/2ML IJ SOLN
4.0000 mg | Freq: Four times a day (QID) | INTRAMUSCULAR | Status: DC | PRN
  Administered 2017-03-12 – 2017-03-13 (×2): 4 mg via INTRAVENOUS
  Filled 2017-03-12 (×2): qty 2

## 2017-03-12 MED ORDER — DIPHENHYDRAMINE HCL 50 MG/ML IJ SOLN: 12.5000 mg | INTRAMUSCULAR | Status: DC | PRN

## 2017-03-12 MED ORDER — EPHEDRINE 5 MG/ML INJ
10.0000 mg | INTRAVENOUS | Status: DC | PRN
  Filled 2017-03-12: qty 2

## 2017-03-12 MED ORDER — PHENYLEPHRINE 40 MCG/ML (10ML) SYRINGE FOR IV PUSH (FOR BLOOD PRESSURE SUPPORT)
80.0000 ug | PREFILLED_SYRINGE | INTRAVENOUS | Status: DC | PRN
  Filled 2017-03-12: qty 10
  Filled 2017-03-12: qty 5

## 2017-03-12 MED ORDER — OXYCODONE-ACETAMINOPHEN 5-325 MG PO TABS: 2.0000 | ORAL_TABLET | ORAL | Status: DC | PRN

## 2017-03-12 MED ORDER — TERBUTALINE SULFATE 1 MG/ML IJ SOLN
0.2500 mg | Freq: Once | INTRAMUSCULAR | Status: DC | PRN
  Filled 2017-03-12: qty 1

## 2017-03-12 MED ORDER — LACTATED RINGERS IV SOLN
INTRAVENOUS | Status: DC
  Administered 2017-03-12 (×4): via INTRAVENOUS

## 2017-03-12 MED ORDER — OXYTOCIN BOLUS FROM INFUSION
500.0000 mL | Freq: Once | INTRAVENOUS | Status: AC
  Administered 2017-03-13: 500 mL via INTRAVENOUS

## 2017-03-12 MED ORDER — LIDOCAINE HCL (PF) 1 % IJ SOLN
INTRAMUSCULAR | Status: DC | PRN
  Administered 2017-03-12 (×2): 5 mL

## 2017-03-12 MED ORDER — LACTATED RINGERS IV SOLN: 500.0000 mL | INTRAVENOUS | Status: DC | PRN

## 2017-03-12 MED ORDER — PHENYLEPHRINE 40 MCG/ML (10ML) SYRINGE FOR IV PUSH (FOR BLOOD PRESSURE SUPPORT)
80.0000 ug | PREFILLED_SYRINGE | INTRAVENOUS | Status: DC | PRN
  Filled 2017-03-12: qty 5

## 2017-03-12 MED ORDER — SOD CITRATE-CITRIC ACID 500-334 MG/5ML PO SOLN: 30.0000 mL | ORAL | Status: DC | PRN

## 2017-03-12 MED ORDER — PNEUMOCOCCAL VAC POLYVALENT 25 MCG/0.5ML IJ INJ
0.5000 mL | INJECTION | INTRAMUSCULAR | Status: DC
  Filled 2017-03-12: qty 0.5

## 2017-03-12 MED ORDER — OXYTOCIN 40 UNITS IN LACTATED RINGERS INFUSION - SIMPLE MED
1.0000 m[IU]/min | INTRAVENOUS | Status: DC
  Administered 2017-03-12: 2 m[IU]/min via INTRAVENOUS

## 2017-03-12 MED ORDER — INFLUENZA VAC SPLIT QUAD 0.5 ML IM SUSY
0.5000 mL | PREFILLED_SYRINGE | INTRAMUSCULAR | Status: DC
  Filled 2017-03-12: qty 0.5

## 2017-03-12 NOTE — Progress Notes (Signed)
Patient ID: [name], female   DOB: 1990/11/05, 26 y.o.   MRN: [id number] Pt doing well. Not appreciating contractions. +Fms VSS CAT 1  Rare contractions  SVE 1/80/-2  Bedside US confirms vertex  A/P: Prime at 37wks - iol for pih and oligo; stable         Cook foley placed 60u/40v         Cytotec third dose placed          Expectant mgmt

## 2017-03-12 NOTE — Progress Notes (Signed)
Pt sitting up on side of bed.  Pt changed position

## 2017-03-12 NOTE — Progress Notes (Signed)
Patient ID: Sonya RiddleCori L Isadore, female   DOB: 1990/03/28, 26 y.o.   MRN: [id number] Pt comfortable with epidural. No complaints. +Fms VSS Cat 1, 150 Toco q 3-655mins 5.5/100/-2  IUPC placed  Continue pitocin per protocol Anticipate svd

## 2017-03-12 NOTE — Anesthesia Pain Management Evaluation Note (Signed)
  [facility] Pain Management Visit Note  Patient: Birdie Riddleori L Menken, 26 y.o., female  "Hello I am a member of the anesthesia team at [facility]. We have an anesthesia team available at all times to provide care throughout the hospital, including epidural management and anesthesia for C-section. I don't know your plan for the delivery whether it a natural birth, water birth, IV sedation, nitrous supplementation, doula or epidural, but we want to meet your pain goals."   1.Was your pain managed to your expectations on prior hospitalizations?   No prior hospitalizations  2.What is your expectation for pain management during this hospitalization?     Epidural  3.How can we help you reach that goal?   Record the patient's initial score and the patient's pain goal.   Pain: 0  Pain Goal: 5 [facility] wants you to be able to say your pain was always managed very well.  Laban EmperorMalinova,Pearlena Ow Hristova 03/12/2017

## 2017-03-12 NOTE — Anesthesia Preprocedure Evaluation (Signed)
Anesthesia Evaluation  Patient identified by MRN, date of birth, ID band Patient awake    Reviewed: Allergy & Precautions, H&P , NPO status , Patient's Chart, lab work & pertinent test results  History of Anesthesia Complications Negative for: history of anesthetic complications  Airway Mallampati: II  TM Distance: >3 FB Neck ROM: full    Dental no notable dental hx. (+) Teeth Intact   Pulmonary neg pulmonary ROS, asthma ,    Pulmonary exam normal breath sounds clear to auscultation       Cardiovascular hypertension ( PIH), negative cardio ROS Normal cardiovascular exam Rhythm:regular Rate:Normal     Neuro/Psych negative neurological ROS  negative psych ROS   GI/Hepatic negative GI ROS, Neg liver ROS,   Endo/Other  Morbid obesity  Renal/GU negative Renal ROS  negative genitourinary   Musculoskeletal Lumbar disc disease   Abdominal   Peds  Hematology negative hematology ROS (+)   Anesthesia Other Findings Aggravation of back pain in setting of lumbar disc disease discussed  Reproductive/Obstetrics (+) Pregnancy                             Anesthesia Physical Anesthesia Plan  ASA: III  Anesthesia Plan: Epidural   Post-op Pain Management:    Induction:   PONV Risk Score and Plan:   Airway Management Planned:   Additional Equipment:   Intra-op Plan:   Post-operative Plan:   Informed Consent: I have reviewed the patients History and Physical, chart, labs and discussed the procedure including the risks, benefits and alternatives for the proposed anesthesia with the patient or authorized representative who has indicated his/her understanding and acceptance.     Plan Discussed with:   Anesthesia Plan Comments:         Anesthesia Quick Evaluation

## 2017-03-12 NOTE — Anesthesia Procedure Notes (Signed)
Epidural Patient location during procedure: OB  Staffing Anesthesiologist: Eben Choinski, MD Performed: anesthesiologist   Preanesthetic Checklist Completed: patient identified, site marked, surgical consent, pre-op evaluation, timeout performed, IV checked, risks and benefits discussed and monitors and equipment checked  Epidural Patient position: sitting Prep: DuraPrep Patient monitoring: heart rate, continuous pulse ox and blood pressure Approach: right paramedian Location: L3-L4 Injection technique: LOR saline  Needle:  Needle type: Tuohy  Needle gauge: 17 G Needle length: 9 cm and 9 Needle insertion depth: 7 cm Catheter type: closed end flexible Catheter size: 20 Guage Catheter at skin depth: 11 cm Test dose: negative  Assessment Events: blood not aspirated, injection not painful, no injection resistance, negative IV test and no paresthesia  Additional Notes Patient identified. Risks/Benefits/Options discussed with patient including but not limited to bleeding, infection, nerve damage, paralysis, failed block, incomplete pain control, headache, blood pressure changes, nausea, vomiting, reactions to medication both or allergic, itching and postpartum back pain. Confirmed with bedside nurse the patient's most recent platelet count. Confirmed with patient that they are not currently taking any anticoagulation, have any bleeding history or any family history of bleeding disorders. Patient expressed understanding and wished to proceed. All questions were answered. Sterile technique was used throughout the entire procedure. Please see nursing notes for vital signs. Test dose was given through epidural needle and negative prior to continuing to dose epidural or start infusion. Warning signs of high block given to the patient including shortness of breath, tingling/numbness in hands, complete motor block, or any concerning symptoms with instructions to call for help. Patient was given  instructions on fall risk and not to get out of bed. All questions and concerns addressed with instructions to call with any issues.     

## 2017-03-12 NOTE — Progress Notes (Signed)
Patient ID: Sonya RiddleCori L Blitzer, female   DOB: November 19, 1990, 26 y.o.   MRN: [id number] Cooks foley out Cervix now 4-5cm dil AROM performed and FSE placed for better monitoring Pit per protocol Pain control prn

## 2017-03-12 NOTE — H&P (Signed)
Sonya RiddleCori L Jackson is a 26 y.o. female presenting [facility] and borderline oligohydramnios. Pt begun having mildly elevated BP at [redacted]weeks gestation. Preeclampsia workup has been negative to date' she has been getting serial nsts/bpp and us in office yesterday noted afi of 6. Per mfm recommend to admit. GBS is negative. Neg essential panel and first trimester screen. Hx asthma - no exac in pregnancy  OB History    No data available     No past medical history on file. No past surgical history on file. Family History: family history is not on file. Social History:  has no tobacco, alcohol, and drug history on file.     Maternal Diabetes: No Genetic Screening: Normal Maternal Ultrasounds/Referrals: Normal Fetal Ultrasounds or other Referrals:  None Maternal Substance Abuse:  No Significant Maternal Medications:  None Significant Maternal Lab Results:  Lab values include: Group B Strep negative Other Comments:  None  Review of Systems  Constitutional: Positive for malaise/fatigue. Negative for chills, fever and weight loss.  Eyes: Negative for blurred vision and double vision.  Respiratory: Negative for shortness of breath.   Cardiovascular: Negative for chest pain.  Gastrointestinal: Positive for abdominal pain. Negative for heartburn, nausea and vomiting.  Genitourinary: Negative for dysuria.  Musculoskeletal: Positive for back pain. Negative for myalgias.  Skin: Negative for itching and rash.  Neurological: Negative for dizziness.  Endo/Heme/Allergies: Does not bruise/bleed easily.  Psychiatric/Behavioral: Negative for depression, hallucinations, substance abuse and suicidal ideas. The patient is nervous/anxious.    Maternal Medical History:  Reason for admission: Nausea. pih at term, oligohydramnios  Fetal activity: Perceived fetal activity is normal.   Last perceived fetal movement was within the past hour.    Prenatal complications: PIH and oligohydramnios.    Prenatal Complications - Diabetes: none.      There were no vitals taken for this visit. Maternal Exam:  Abdomen: Patient reports no abdominal tenderness. Estimated fetal weight is AGA.   Fetal presentation: vertex  Introitus: Normal vulva. Vulva is negative for condylomata and lesion.  Normal vagina.  Vagina is negative for condylomata.  Pelvis: adequate for delivery.   Cervix: Cervix evaluated by digital exam.     Physical Exam  Constitutional: She is oriented to person, place, and time. She appears well-developed and well-nourished.  Neck: Normal range of motion.  Cardiovascular: Normal rate.  Respiratory: Effort normal.  GI: Soft.  Genitourinary: Vagina normal and uterus normal. Vulva exhibits no lesion.  Musculoskeletal: Normal range of motion.  Neurological: She is alert and oriented to person, place, and time.  Skin: Skin is warm.  Psychiatric: She has a normal mood and affect. Her behavior is normal. Judgment and thought content normal.    Prenatal labs: ABO, Rh:   Antibody:   Rubella:   RPR:    HBsAg:    HIV:    GBS:     Assessment/Plan: 26yo prime at 3837 0/[redacted]wks gestation with PIH and borderline oligohydramnios for iol Cytotec to start GBS neg Monitor bp and treat as indicated; preE labs obtained Expectant mgmt   Sonya Jackson 03/12/2017, 12:31 AM

## 2017-03-12 NOTE — Progress Notes (Signed)
Patient ID: Sonya RiddleCori L Ellett, female   DOB: [birth year], [age] y.o.   MRN: [id number] Pt reports pelvic pressure ( has had through pregnancy), appreciating contractions but tolerable. +Fms VSS  - 130-139/67-72 EFM - cat 1, 140 TOCO - no contractions noted SVE - cooks foley still in place, small bloody show  A/P: Prime with pih and oligo at 37 weeks; bp stable         S/p cytotec; will start pitocin with cooks bulb in place         Pain control prn

## 2017-03-13 ENCOUNTER — Encounter (HOSPITAL_COMMUNITY): Payer: Self-pay

## 2017-03-13 LAB — CBC
HCT: 34.3 % — ABNORMAL LOW (ref 36.0–46.0)
HEMOGLOBIN: 11.9 g/dL — AB (ref 12.0–15.0)
MCH: 30.6 pg (ref 26.0–34.0)
MCHC: 34.7 g/dL (ref 30.0–36.0)
MCV: 88.2 fL (ref 78.0–100.0)
PLATELETS: 206 10*3/uL (ref 150–400)
RBC: 3.89 MIL/uL (ref 3.87–5.11)
RDW: 12.9 % (ref 11.5–15.5)
WBC: 17.6 10*3/uL — AB (ref 4.0–10.5)

## 2017-03-13 MED ORDER — PRENATAL MULTIVITAMIN CH
1.0000 | ORAL_TABLET | Freq: Every day | ORAL | Status: DC
  Administered 2017-03-13 – 2017-03-15 (×3): 1 via ORAL
  Filled 2017-03-13 (×3): qty 1

## 2017-03-13 MED ORDER — SIMETHICONE 80 MG PO CHEW: 80.0000 mg | CHEWABLE_TABLET | ORAL | Status: DC | PRN

## 2017-03-13 MED ORDER — COCONUT OIL OIL: 1.0000 "application " | TOPICAL_OIL | Status: DC | PRN

## 2017-03-13 MED ORDER — BENZOCAINE-MENTHOL 20-0.5 % EX AERO: 1.0000 "application " | INHALATION_SPRAY | CUTANEOUS | Status: DC | PRN

## 2017-03-13 MED ORDER — IBUPROFEN 600 MG PO TABS
600.0000 mg | ORAL_TABLET | Freq: Four times a day (QID) | ORAL | Status: DC
  Administered 2017-03-13 – 2017-03-15 (×9): 600 mg via ORAL
  Filled 2017-03-13 (×9): qty 1

## 2017-03-13 MED ORDER — DIBUCAINE 1 % RE OINT: 1.0000 "application " | TOPICAL_OINTMENT | RECTAL | Status: DC | PRN

## 2017-03-13 MED ORDER — TETANUS-DIPHTH-ACELL PERTUSSIS 5-2.5-18.5 LF-MCG/0.5 IM SUSP: 0.5000 mL | Freq: Once | INTRAMUSCULAR | Status: DC

## 2017-03-13 MED ORDER — ACETAMINOPHEN 325 MG PO TABS: 650.0000 mg | ORAL_TABLET | ORAL | Status: DC | PRN

## 2017-03-13 MED ORDER — ONDANSETRON HCL 4 MG/2ML IJ SOLN: 4.0000 mg | INTRAMUSCULAR | Status: DC | PRN

## 2017-03-13 MED ORDER — ZOLPIDEM TARTRATE 5 MG PO TABS: 5.0000 mg | ORAL_TABLET | Freq: Every evening | ORAL | Status: DC | PRN

## 2017-03-13 MED ORDER — SENNOSIDES-DOCUSATE SODIUM 8.6-50 MG PO TABS
2.0000 | ORAL_TABLET | ORAL | Status: DC
  Administered 2017-03-13 – 2017-03-14 (×2): 2 via ORAL
  Filled 2017-03-13 (×2): qty 2

## 2017-03-13 MED ORDER — WITCH HAZEL-GLYCERIN EX PADS: 1.0000 "application " | MEDICATED_PAD | CUTANEOUS | Status: DC | PRN

## 2017-03-13 MED ORDER — ONDANSETRON HCL 4 MG PO TABS: 4.0000 mg | ORAL_TABLET | ORAL | Status: DC | PRN

## 2017-03-13 MED ORDER — DIPHENHYDRAMINE HCL 25 MG PO CAPS: 25.0000 mg | ORAL_CAPSULE | Freq: Four times a day (QID) | ORAL | Status: DC | PRN

## 2017-03-13 NOTE — Progress Notes (Signed)
Patient ID: [name], female   DOB: [birth year], 26 y.o.   MRN: [id number] Pt comfortable with no complaints VSS Cat 1 Complete/100/+1  Will labor down then attempt pushing as no current urge to push Anticipate svd

## 2017-03-13 NOTE — Anesthesia Postprocedure Evaluation (Signed)
Anesthesia Post Note  Patient: Sonya Jackson  Procedure(s) Performed: AN AD HOC LABOR EPIDURAL     Patient location during evaluation: Mother Baby Anesthesia Type: Epidural Level of consciousness: awake and alert and oriented Pain management: satisfactory to patient Vital Signs Assessment: post-procedure vital signs reviewed and stable Respiratory status: spontaneous breathing and nonlabored ventilation Cardiovascular status: stable Postop Assessment: no headache, no backache, no signs of nausea or vomiting, adequate PO intake and patient able to bend at knees (patient up walking) Anesthetic complications: no    Last Vitals:  Vitals:   03/13/17 1000 03/13/17 1110  BP: 124/66 119/73  Pulse: 97 85  Resp: 18 18  Temp: 37.1 C 37.3 C  SpO2:      Last Pain:  Vitals:   03/13/17 1110  TempSrc: Oral  PainSc: (P) 0-No pain   Pain Goal:                 Moraima Burd

## 2017-03-14 LAB — CBC
HEMATOCRIT: 31.5 % — AB (ref 36.0–46.0)
HEMOGLOBIN: 10.7 g/dL — AB (ref 12.0–15.0)
MCH: 30.5 pg (ref 26.0–34.0)
MCHC: 34 g/dL (ref 30.0–36.0)
MCV: 89.7 fL (ref 78.0–100.0)
Platelets: 194 10*3/uL (ref 150–400)
RBC: 3.51 MIL/uL — ABNORMAL LOW (ref 3.87–5.11)
RDW: 13.2 % (ref 11.5–15.5)
WBC: 14.3 10*3/uL — ABNORMAL HIGH (ref 4.0–10.5)

## 2017-03-14 MED ORDER — IBUPROFEN 600 MG PO TABS: 600.0000 mg | ORAL_TABLET | Freq: Four times a day (QID) | ORAL | 0 refills | Status: DC

## 2017-03-14 MED ORDER — RHO D IMMUNE GLOBULIN 1500 UNIT/2ML IJ SOSY
300.0000 ug | PREFILLED_SYRINGE | Freq: Once | INTRAMUSCULAR | Status: AC
  Administered 2017-03-14: 300 ug via INTRAVENOUS
  Filled 2017-03-14: qty 2

## 2017-03-14 NOTE — Discharge Instructions (Signed)
As per discharge pamphlet °

## 2017-03-14 NOTE — Discharge Summary (Addendum)
OB Discharge Summary     Patient Name: Sonya Jackson DOB: [birth year] MRN: [id number]  Date of admission: 03/12/2017 Delivering MD: Pryor [facility]choaBANGA, [facility]IA [facility]   Date of discharge: 03/15/2017  Admitting diagnosis: 37 WK DIRECT ADMIT Intrauterine pregnancy: 2476w1d     Secondary diagnosis:  Active Problems:   Oligohydramnios antepartum, third trimester, fetus 1   PIH (pregnancy induced hypertension), third trimester   SVD (spontaneous vaginal delivery)   Postpartum care following vaginal delivery      Discharge diagnosis: Term Pregnancy Delivered, Gestational Hypertension and oligohydramnios                                  Hospital course:  Induction of Labor With Vaginal Delivery   26 y.o. yo G1P1001 at 7376w1d was admitted to the hospital 03/12/2017 for induction of labor.  Indication for induction: Gestational hypertension and oligohydramnios.  Patient had an uncomplicated labor course as follows: Membrane Rupture Time/Date: 6:28 PM ,03/12/2017   Intrapartum Procedures: Episiotomy: None [1]                                         Lacerations:  1st degree [2];Perineal [11]  Patient had delivery of a Viable infant.  Information for the patient's newborn:  Cruzita LedererSeawell, Boy Afomia [[id number]][[id number]]  Delivery Method: Vaginal, Spontaneous(Filed from Delivery Summary)   03/13/2017  Details of delivery can be found in separate delivery note.  Patient had a routine postpartum course. Patient is discharged home 03/14/17.  Physical exam  Vitals:   03/13/17 1000 03/13/17 1110 03/13/17 1523 03/14/17 0500  BP: 124/66 119/73 104/61 116/70  Pulse: 97 85 71 89  Resp: 18 18 19 18   Temp: 98.7 F (37.1 C) 99.1 F (37.3 C) 98.8 F (37.1 C) 98.4 F (36.9 C)  TempSrc: Oral Oral Oral Oral  SpO2:      Weight:      Height:       General: alert Lochia: appropriate Uterine Fundus: firm  Labs: Lab Results  Component Value Date   WBC 14.3 (H) 03/14/2017   HGB 10.7 (L) 03/14/2017   HCT 31.5 (L)  03/14/2017   MCV 89.7 03/14/2017   PLT 194 03/14/2017   CMP Latest Ref Rng & Units 03/12/2017  Total Protein 6.5 - 8.1 g/dL 6.2(L)  Total Bilirubin 0.3 - 1.2 mg/dL 7.8(G0.2(L)  Alkaline Phos 38 - 126 U/L 90  AST 15 - 41 U/L 23  ALT 14 - 54 U/L 17    Discharge instruction: per After Visit Summary and "Baby and Me Booklet".  After visit meds:  Allergies as of 03/14/2017      Reactions   Shellfish Allergy Hives      Medication List    TAKE these medications   albuterol 108 (90 Base) MCG/ACT inhaler Commonly known as:  PROVENTIL HFA;VENTOLIN HFA Inhale 2 puffs into the lungs every 6 (six) hours as needed for wheezing or shortness of breath.   ibuprofen 600 MG tablet Commonly known as:  ADVIL,MOTRIN Take 1 tablet (600 mg total) by mouth every 6 (six) hours.       Diet: routine diet  Activity: Advance as tolerated. Pelvic rest for 6 weeks.   Outpatient follow up:6 weeks   Newborn Data: Live born female  Birth Weight: 5 lb 15.4 oz (2705 g) APGAR: 9, 9  Newborn Delivery   Birth date/time:  03/13/2017 07:20:00 Delivery type:  Vaginal, Spontaneous     Baby Feeding: Breast Disposition:home with mother   03/14/2017 Zenaida Nieceodd D Areyanna Figeroa, MD

## 2017-03-14 NOTE — Progress Notes (Addendum)
PPD #1 No problems, working on feeding Afeb, VSS, all BP are normal Fundus firm, NT at U-1 Continue routine postpartum care, I will put in discharge for this evening

## 2017-03-14 NOTE — Lactation Note (Signed)
This note was copied from a baby's chart. Lactation Consultation Note  Patient Name: Sonya Jackson WUJWJ'XToday's Date: 03/14/2017 Reason for consult: Initial assessment Baby at 29 hr of life. Mom reports baby is not latching. She has been using the [employer] pump q3hr but does not get enough to fed baby per volume guidelines so they are also offering formula. Mom has Medicaid but is not active with WIC, given information, and tried to fax in a referral but [place] [facility] "[facility]". Discussed baby behavior, feeding frequency, pumping, supplementing guidelines, baby belly size, voids, wt loss, breast changes, and nipple care. Mom stated she can manually express and knows how to spoon feed but has chosen to use a bottle. Left lactation phone number on the white board for mom to call at the next feeding for latch help. Given lactation handouts. Aware of OP services and support group.   Maternal Data Has patient been taught Hand Expression?: Yes  Feeding Feeding Type: Breast Fed Length of feed: 3 min  LATCH Score                   Interventions    Lactation Tools Discussed/Used Tools: Nipple Shields Nipple shield size: 16   Consult Status Consult Status: Follow-up Date: 03/14/17 Follow-up type: In-patient    Sonya Jackson 03/14/2017, 12:56 PM

## 2017-03-15 LAB — RH IG WORKUP (INCLUDES ABO/RH)
ABO/RH(D): A NEG
Fetal Screen: NEGATIVE
Gestational Age(Wks): 37.2
UNIT DIVISION: 0

## 2017-03-15 MED ORDER — PRENATAL MULTIVITAMIN CH: 1.0000 | ORAL_TABLET | Freq: Every day | ORAL | 3 refills | Status: DC

## 2017-03-15 NOTE — Progress Notes (Signed)
Post Partum Day 2 Subjective: no complaints, up ad lib, voiding, tolerating PO and nl lochia, pain controllled  Objective: Blood pressure 114/72, pulse 83, temperature 98.1 F (36.7 C), temperature source Oral, resp. rate 20, height 5\' 1"  (1.549 m), weight 98 kg (216 lb), SpO2 100 %, unknown if currently breastfeeding.  Physical Exam:  General: alert and no distress Lochia: appropriate Uterine Fundus: firm   Recent Labs    03/13/17 0826 03/14/17 0601  HGB 11.9* 10.7*  HCT 34.3* 31.5*    Assessment/Plan: Discharge home, Breastfeeding and Lactation consult.   D/c with motrin and PNV.  F/u 6 wks   LOS: 3 days   Sonya Jackson 03/15/2017, 7:08 AM

## 2017-03-15 NOTE — Discharge Summary (Signed)
OB Discharge Summary     Patient Name: Sonya Jackson DOB: [birth year] MRN: [id number]  Date of admission: 03/12/2017 Delivering MD: Pryor [facility]cho[facility]BANGA[facility]   Date of discharge: 03/15/2017  Admitting diagnosis: 37 WK DIRECT ADMIT Intrauterine pregnancy: 3962w1d     Secondary diagnosis:  Active Problems:   Oligohydramnios antepartum, third trimester, fetus 1   PIH (pregnancy induced hypertension), third trimester   SVD (spontaneous vaginal delivery)   Postpartum care following vaginal delivery  Additional problems: N/A     Discharge diagnosis: Term Pregnancy Delivered                                                                                                Post partum procedures:rhogam  Augmentation: AROM, Pitocin and Cytotec  Complications: None  Hospital course:  Induction of Labor With Vaginal Delivery   26 y.o. yo G1P1001 at 6262w1d was admitted to the hospital 03/12/2017 for induction of labor.  Indication for induction: Gestational hypertension.  Patient had an uncomplicated labor course as follows: Membrane Rupture Time/Date: 6:28 PM ,03/12/2017   Intrapartum Procedures: Episiotomy: None [1]                                         Lacerations:  1st degree [2];Perineal [11]  Patient had delivery of a Viable infant.  Information for the patient's newborn:  Cruzita LedererSeawell, Boy Meliana [[id number]][[id number]]  Delivery Method: Vaginal, Spontaneous(Filed from Delivery Summary)   03/13/2017  Details of delivery can be found in separate delivery note.  Patient had a routine postpartum course. Patient is discharged home 03/15/17.  Physical exam  Vitals:   03/13/17 1523 03/14/17 0500 03/14/17 1724 03/15/17 0530  BP: 104/61 116/70 114/72 117/65  Pulse: 71 89 83 75  Resp: 19 18 20 17   Temp: 98.8 F (37.1 C) 98.4 F (36.9 C) 98.1 F (36.7 C) 97.8 F (36.6 C)  TempSrc: Oral Oral Oral Oral  SpO2:   100% 100%  Weight:      Height:       General: alert and no distress Lochia:  appropriate Uterine Fundus: firm  Labs: Lab Results  Component Value Date   WBC 14.3 (H) 03/14/2017   HGB 10.7 (L) 03/14/2017   HCT 31.5 (L) 03/14/2017   MCV 89.7 03/14/2017   PLT 194 03/14/2017   CMP Latest Ref Rng & Units 03/12/2017  Total Protein 6.5 - 8.1 g/dL 6.2(L)  Total Bilirubin 0.3 - 1.2 mg/dL 1.0(U0.2(L)  Alkaline Phos 38 - 126 U/L 90  AST 15 - 41 U/L 23  ALT 14 - 54 U/L 17    Discharge instruction: per After Visit Summary and "Baby and Me Booklet".  After visit meds:  Allergies as of 03/15/2017      Reactions   Shellfish Allergy Hives      Medication List    TAKE these medications   albuterol 108 (90 Base) MCG/ACT inhaler Commonly known as:  PROVENTIL HFA;VENTOLIN HFA Inhale 2 puffs into the lungs every 6 (six) hours  as needed for wheezing or shortness of breath.   ibuprofen 600 MG tablet Commonly known as:  ADVIL,MOTRIN Take 1 tablet (600 mg total) by mouth every 6 (six) hours.   prenatal multivitamin Tabs tablet Take 1 tablet by mouth daily at 12 noon.       Diet: routine diet  Activity: Advance as tolerated. Pelvic rest for 6 weeks.   Outpatient follow up:6 weeks Follow up Appt:No future appointments. Follow up Visit:No Follow-up on file.  Postpartum contraception: Undecided  Newborn Data: Live born female  Birth Weight: 5 lb 15.4 oz (2705 g) APGAR: 9, 9  Newborn Delivery   Birth date/time:  03/13/2017 07:20:00 Delivery type:  Vaginal, Spontaneous     Baby Feeding: Breast Disposition:home with mother   03/15/2017 Sherian ReinJody Bovard-Stuckert, MD

## 2017-03-15 NOTE — Lactation Note (Signed)
This note was copied from a baby's chart. Lactation Consultation Note  Patient Name: Sonya Inocente SallesCori Mutch MVHQI'OToday's Date: 03/15/2017 Reason for consult: Follow-up assessment;Early term 37-38.6wks;Infant < 6lbs;Difficult latch   Follow up with first time mom of 51 hour old infant. Infant with 1 BF attempt, 9 formula feeds of 3-14 cc using a Dr. Theora GianottiBrown's bottle, 4 voids and 3 stools in 24 hours preceding this assessment.   Mom reports feedings are going well. Infant is not BF well, parents are feeding with a bottle. Enc mom to keep attempting BF with infant to keep him familiar with the breast. Mom reports she has not pumped this morning but has been pumping with DEBP. Mom is not obtaining colostrum and is not feeling breast changes.  Mom has a manual pump and Evenflo pump for home use, [place] [facility] [place] [facility] about a DEBP. Enc mom to take all pump tubing home with her.   Reviewed I/O, engorgement prevention/treatment, hand expression, increasing feeding volumes for infant per day of age (reviewed amounts on handout), and breast milk expression and storage.   Mom denies any questions/concerns at this time. Reviewed LC Brochure. Mom aware of OP services, BF Support Groups and LC phone #. Mom declined to schedule OP visit with LC, enc mom to call with any questions/concerns or to schedule OP appt if she wishes.    Maternal Data Has patient been taught Hand Expression?: Yes Does the patient have breastfeeding experience prior to this delivery?: No  Feeding Feeding Type: Bottle Fed - Formula Nipple Type: Other(Dr Brown's) Length of feed: 20 min  LATCH Score                   Interventions    Lactation Tools Discussed/Used WIC Program: Yes Pump Review: Setup, frequency, and cleaning;Milk Storage Initiated by:: Reviewed and encouraged every 2-3 hours for 15-20 minutes   Consult Status Consult Status: Complete Follow-up type: Call as  needed    Ed BlalockSharon S Yenni Carra 03/15/2017, 11:08 AM

## 2017-03-16 LAB — TYPE AND SCREEN
ABO/RH(D): A NEG
Antibody Screen: POSITIVE
Unit division: 0
Unit division: 0

## 2017-03-16 LAB — BPAM RBC
Blood Product Expiration Date: 201901022359
Blood Product Expiration Date: 201901042359
Unit Type and Rh: 600
Unit Type and Rh: 600

## 2018-05-14 ENCOUNTER — Emergency Department (HOSPITAL_COMMUNITY): Payer: Medicaid Other

## 2018-05-14 ENCOUNTER — Encounter (HOSPITAL_COMMUNITY): Payer: Self-pay

## 2018-05-14 ENCOUNTER — Emergency Department (HOSPITAL_COMMUNITY)
Admission: EM | Admit: 2018-05-14 | Discharge: 2018-05-14 | Disposition: A | Discharge status: Home / Self Care | Payer: Medicaid Other | Attending: Emergency Medicine | Admitting: Emergency Medicine

## 2018-05-14 DIAGNOSIS — R0789 Other chest pain: Secondary | ICD-10-CM | POA: Diagnosis not present

## 2018-05-14 DIAGNOSIS — R42 Dizziness and giddiness: Secondary | ICD-10-CM | POA: Insufficient documentation

## 2018-05-14 DIAGNOSIS — R002 Palpitations: Secondary | ICD-10-CM | POA: Insufficient documentation

## 2018-05-14 DIAGNOSIS — J45909 Unspecified asthma, uncomplicated: Secondary | ICD-10-CM | POA: Insufficient documentation

## 2018-05-14 DIAGNOSIS — R0602 Shortness of breath: Secondary | ICD-10-CM | POA: Diagnosis not present

## 2018-05-14 DIAGNOSIS — R200 Anesthesia of skin: Secondary | ICD-10-CM | POA: Insufficient documentation

## 2018-05-14 LAB — CBC
HCT: 44.7 % (ref 36.0–46.0)
HEMOGLOBIN: 14.9 g/dL (ref 12.0–15.0)
MCH: 29 pg (ref 26.0–34.0)
MCHC: 33.3 g/dL (ref 30.0–36.0)
MCV: 87.1 fL (ref 80.0–100.0)
NRBC: 0 % (ref 0.0–0.2)
PLATELETS: 353 10*3/uL (ref 150–400)
RBC: 5.13 MIL/uL — ABNORMAL HIGH (ref 3.87–5.11)
RDW: 12.2 % (ref 11.5–15.5)
WBC: 9.6 10*3/uL (ref 4.0–10.5)

## 2018-05-14 LAB — D-DIMER, QUANTITATIVE (NOT AT ARMC): D DIMER QUANT: 0.35 ug{FEU}/mL (ref 0.00–0.50)

## 2018-05-14 LAB — TSH: TSH: 2.801 u[IU]/mL (ref 0.350–4.500)

## 2018-05-14 LAB — I-STAT TROPONIN, ED: TROPONIN I, POC: 0 ng/mL (ref 0.00–0.08)

## 2018-05-14 LAB — I-STAT BETA HCG BLOOD, ED (MC, WL, AP ONLY)

## 2018-05-14 LAB — BASIC METABOLIC PANEL
Anion gap: 10 (ref 5–15)
BUN: 19 mg/dL (ref 6–20)
CALCIUM: 9.1 mg/dL (ref 8.9–10.3)
CO2: 24 mmol/L (ref 22–32)
Chloride: 102 mmol/L (ref 98–111)
Creatinine, Ser: 0.89 mg/dL (ref 0.44–1.00)
GFR calc Af Amer: >60 mL/min (ref >60)
Glucose, Bld: 96 mg/dL (ref 70–99)
POTASSIUM: 3.9 mmol/L (ref 3.5–5.1)
SODIUM: 136 mmol/L (ref 135–145)

## 2018-05-14 NOTE — ED Provider Notes (Signed)
[facility] EMERGENCY DEPARTMENT Provider Note   CSN: [id number] Arrival date & time: 05/14/18  0231    History   Chief Complaint Chief Complaint  Patient presents with  . Chest Pain    HPI Sonya Jackson is a 28 y.o. female who presents with chest pain.  Past medical history significant for asthma.  The patient states that over the past several weeks she has had intermittent substernal chest pain that radiates throughout her chest.  She also states that sometimes her right arm will intermittently go numb.  She reports associated mild shortness of breath and felt feel like she cannot get a deep breath.  The pain will radiate at times to the left side of her neck.  Over the past day the pain has become more constant therefore she decided to come to the ED tonight.  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  She also has been having palpitations will feel lightheaded at times.  She states she has had that in the past when she was on diet medication but she is not on any medicines or over-the-counter supplements at this time.  She denies fever, chills, syncope, cough, wheezing, abdominal pain, nausea, vomiting, leg swelling or calf pain. No recent surgery/travel/immobilization, hx of cancer, leg swelling, hemoptysis, prior DVT/PE, or hormone use. The pt endorses a lot of stress in her life but states this isn't new.    HPI  Past Medical History:  Diagnosis Date  . Asthma     Patient Active Problem List   Diagnosis Date Noted  . SVD (spontaneous vaginal delivery) 03/13/2017  . Postpartum care following vaginal delivery 03/13/2017  . Oligohydramnios antepartum, third trimester, fetus 1 03/12/2017  . PIH (pregnancy induced hypertension), third trimester 03/12/2017    History reviewed. No pertinent surgical history.   OB History    Gravida  1   Para  1   Term  1   Preterm      AB      Living  1     SAB      TAB      Ectopic      Multiple  0   Live Births  1            Home Medications    Prior to Admission medications   Medication Sig Start Date End Date Taking? Authorizing Provider  ibuprofen (ADVIL,MOTRIN) 600 MG tablet Take 1 tablet (600 mg total) by mouth every 6 (six) hours. Patient not taking: Reported on 05/14/2018 03/14/17   Meisinger, Tawanna Cooler, MD  Prenatal Vit-Fe Fumarate-FA (PRENATAL MULTIVITAMIN) TABS tablet Take 1 tablet by mouth daily at 12 noon. Patient not taking: Reported on 05/14/2018 03/15/17   Sherian Rein, MD    Family History Family History  Problem Relation Age of Onset  . Diabetes Father   . Diabetes Maternal Grandmother     Social History Social History   Tobacco Use  . Smoking status: Never Smoker  . Smokeless tobacco: Never Used  Substance Use Topics  . Alcohol use: No    Frequency: Never  . Drug use: No     Allergies   Shellfish allergy   Review of Systems Review of Systems  Constitutional: Negative for fever.  Respiratory: Positive for shortness of breath. Negative for cough and wheezing.   Cardiovascular: Positive for chest pain and palpitations. Negative for leg swelling.  Gastrointestinal: Negative for abdominal pain,  nausea and vomiting.  Musculoskeletal: Negative for back pain.  Neurological: Positive for light-headedness. Negative for syncope.  All other systems reviewed and are negative.    Physical Exam Updated Vital Signs BP 137/89   Pulse 80   Temp 98.4 F (36.9 C) (Oral)   Resp (!) 22   SpO2 100%   Physical Exam Vitals signs and nursing note reviewed.  Constitutional:      General: She is not in acute distress.    Appearance: She is well-developed.     Comments: Calm and cooperative  HENT:     Head: Normocephalic and atraumatic.  Eyes:     General: No scleral icterus.       Right eye: No discharge.        Left eye: No discharge.     Conjunctiva/sclera: Conjunctivae normal.     Pupils:  Pupils are equal, round, and reactive to light.  Neck:     Musculoskeletal: Normal range of motion.  Cardiovascular:     Rate and Rhythm: Normal rate and regular rhythm.  Pulmonary:     Effort: Pulmonary effort is normal. No respiratory distress.     Breath sounds: Normal breath sounds.  Abdominal:     General: There is no distension.     Palpations: Abdomen is soft.     Tenderness: There is no abdominal tenderness.  Musculoskeletal:     Right lower leg: No edema.     Left lower leg: No edema.     Comments: No leg swelling or calf tenderness  Skin:    General: Skin is warm and dry.  Neurological:     Mental Status: She is alert and oriented to person, place, and time.  Psychiatric:        Behavior: Behavior normal.      ED Treatments / Results  Labs (all labs ordered are listed, but only abnormal results are displayed) Labs Reviewed  CBC - Abnormal; Notable for the following components:      Result Value   RBC 5.13 (*)    All other components within normal limits  BASIC METABOLIC PANEL  D-DIMER, QUANTITATIVE (NOT AT [facility])  TSH  I-STAT TROPONIN, ED  I-STAT BETA HCG BLOOD, ED ([facility], [facility], [facility] ONLY)    EKG EKG Interpretation  Date/Time:  Sunday May 14 2018 02:34:37 EST Ventricular Rate:  101 PR Interval:  144 QRS Duration: 66 QT Interval:  340 QTC Calculation: 440 R Axis:   66 Text Interpretation:  Sinus tachycardia Confirmed by Palumbo, April ([id number]) on 05/14/2018 6:59:29 AM   Radiology Dg Chest 2 View  Result Date: 05/14/2018 CLINICAL DATA:  Chest pain EXAM: CHEST - 2 VIEW COMPARISON:  07/15/2010 FINDINGS: The heart size and mediastinal contours are within normal limits. Both lungs are clear. The visualized skeletal structures are unremarkable. IMPRESSION: No active cardiopulmonary disease. Electronically Signed   By: Jasmine Pang M.D.   On: 05/14/2018 02:56    Procedures Procedures (including critical care time)  Medications Ordered in ED Medications -  No data to display   Initial Impression / Assessment and Plan / ED Course  I have reviewed the triage vital signs and the nursing notes.  Pertinent labs & imaging results that were available during my care of the patient were reviewed by me and considered in my medical decision making (see chart for details).  28 year old female presents with intermittent CP for the past several weeks. In triage she is hypertensive and tachycardic. This has improved  on recheck without intervention. Exam is overall unremarkable. Heart is regular rate and rhythm. Lungs are CTA. Abdomen is soft, non-tender. There are no clinical signs of DVT on exam. EKG is sinus tachycardia. Labs are normal. CXR is normal. D-dimer is normal. Pt does endorse a lot of stress in her life and wonders if that could be causing her symptoms. Encouraged PCP f/u.   Final Clinical Impressions(s) / ED Diagnoses   Final diagnoses:  Atypical chest pain  Palpitations    ED Discharge Orders    None       Bethel Born, PA-C 05/14/18 [id number]    Palumbo, April, MD 05/15/18 [id number]

## 2018-05-14 NOTE — ED Triage Notes (Signed)
Pt reports that for the past 3 weeks she has been having central CP with SOB, radiation to neck L arm, denies n/v

## 2018-05-14 NOTE — ED Notes (Signed)
Patient verbalizes understanding of discharge instructions. Opportunity for questioning and answers were provided. Armband removed by staff, pt discharged from ED.  

## 2018-05-14 NOTE — ED Notes (Signed)
Pt denies chest pain at this time.

## 2018-05-14 NOTE — Discharge Instructions (Signed)
Please establish care with primary doctor for follow up Return if you are worsening

## 2018-05-17 ENCOUNTER — Ambulatory Visit: Payer: Medicaid Other | Admitting: Cardiology

## 2018-05-17 ENCOUNTER — Encounter: Payer: Self-pay | Admitting: Cardiology

## 2018-05-17 VITALS — BP 110/68 | HR 78 | Ht 61.0 in | Wt 201.4 lb

## 2018-05-17 DIAGNOSIS — Z7189 Other specified counseling: Secondary | ICD-10-CM | POA: Diagnosis not present

## 2018-05-17 DIAGNOSIS — R079 Chest pain, unspecified: Secondary | ICD-10-CM

## 2018-05-17 DIAGNOSIS — R002 Palpitations: Secondary | ICD-10-CM | POA: Diagnosis not present

## 2018-05-17 DIAGNOSIS — Z8249 Family history of ischemic heart disease and other diseases of the circulatory system: Secondary | ICD-10-CM

## 2018-05-17 NOTE — Patient Instructions (Addendum)
Medication Instructions:  Your Physician recommend you continue on your current medication as directed.     If you need a refill on your cardiac medications before your next appointment, please call your pharmacy.   Lab work: Your physician recommends that you return for lab work today (lipid, A1C).   Testing/Procedures: Your physician has recommended that you wear a 48 hour holter monitor. Holter monitors are medical devices that record the heart's electrical activity. Doctors most often use these monitors to diagnose arrhythmias. Arrhythmias are problems with the speed or rhythm of the heartbeat. The monitor is a small, portable device. You can wear one while you do your normal daily activities. This is usually used to diagnose what is causing palpitations/syncope (passing out). [address]      Follow-Up: At [facility], you and your health needs are our priority.  As part of our continuing mission to provide you with exceptional heart care, we have created designated Provider Care Teams.  These Care Teams include your primary Cardiologist (physician) and Advanced Practice Providers (APPs -  Physician Assistants and Nurse Practitioners) who all work together to provide you with the care you need, when you need it. You will need a follow up appointment as needed.  Please call our office 2 months in advance to schedule this appointment.  You may see Dr. Cristal Deer or one of the following Advanced Practice Providers on your designated Care Team:   Theodore Demark, PA-C . Joni Reining, DNP, ANP

## 2018-05-17 NOTE — Progress Notes (Signed)
Cardiology Office Note:    Date:  05/17/2018   ID:  Sonya [place], DOB 21-Jul-1990, MRN [id number]  PCP:  Patient, No Pcp Per Does not have PCP Cardiologist:  Jodelle Red, MD PhD  Referring MD: Cy Blamer, MD  CC: chest pain  History of Present Illness:    Sonya [place] is a 28 y.o. female with a hx of asthma who is seen as a new consult at the request of No ref. provider found for the evaluation and management of chest pain. She was seen in the [facility] ER for this on 05/14/18.  Chest pain: -Initial onset: 3-4 weeks ago, father had passed away in 2023/03/28 from a heart attack at age 64. No heart issues prior -Quality: mostly shooting midsternal pain, moving to neck, sometimes right arm numbness. Feels skipped beats, though when she was on the telemetry monitor and having symptoms -Frequency/Duration: comes and goes, usually a few minutes a few times/day. Had the episode for 2 days straight prior to coming to ER. -Associated symptoms: feels lightheaded, short of breath, feels like heart is skipping beat. No nausea, no diaphoresis, no syncope. -Aggravating/alleviating factors: worse with activity. No clear triggers. 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 No other heart disease or stroke.  Past Medical History:  Diagnosis Date  . Asthma     History reviewed. No pertinent surgical history.  Current Medications: Current Outpatient Medications on File Prior to Visit  Medication Sig  . ibuprofen (ADVIL,MOTRIN) 600 MG tablet Take 1 tablet (600 mg total) by mouth every 6 (six) hours.  . Prenatal  Vit-Fe Fumarate-FA (PRENATAL MULTIVITAMIN) TABS tablet Take 1 tablet by mouth daily at 12 noon.   No current facility-administered medications on file prior to visit.      Allergies:   Shellfish allergy   Social History   Socioeconomic History  . Marital status: Single    Spouse name: Not on file  . Number of children: Not on file  . Years of education: Not on file  . Highest education level: Not on file  Occupational History  . Not on file  Social Needs  . Financial resource strain: Not on file  . Food insecurity:    Worry: Not on file    Inability: Not on file  . Transportation needs:    Medical: Not on file    Non-medical: Not on file  Tobacco Use  . Smoking status: Never Smoker  . Smokeless tobacco: Never Used  Substance and Sexual Activity  . Alcohol use: No    Frequency: Never  . Drug use: No  . Sexual activity: Yes    Birth control/protection: None  Lifestyle  . Physical activity:    Days per week: Not on file    Minutes per session: Not on file  . Stress: Not on file  Relationships  . Social connections:    Talks on phone: Not on file    Gets together: Not on file    Attends religious service: Not on file    Active member of club or organization: Not on file    Attends meetings of clubs or organizations: Not on  file    Relationship status: Not on file  Other Topics Concern  . Not on file  Social History Narrative  . Not on file     Family History: The patient's family history includes Diabetes in her father and maternal grandmother.Mom and grandma have thyroid issues. Father had high cholesterol and diabetes before he passed away suddenly from MI at age 50 in 2018-03-08.  ROS:   Please see the history of present illness.  Additional pertinent ROS:  Constitutional: Negative for chills, fever, night sweats, unintentional weight loss  HENT: Negative for ear pain and hearing loss.   Eyes: Negative for loss of vision and eye pain.  Respiratory: Negative  for cough, sputum, shortness of breath, wheezing.   Cardiovascular: See HPI. Gastrointestinal: Negative for abdominal pain, melena, and hematochezia.  Genitourinary: Negative for dysuria and hematuria.  Musculoskeletal: Negative for falls, positive for mild muscle pain.  Skin: Negative for itching and rash.  Neurological: Negative for focal weakness and loss of consciousness. Positive for intermittent right arm numbness Endo/Heme/Allergies: Does not bruise/bleed easily.    EKGs/Labs/Other Studies Reviewed:    The following studies were reviewed today: Recent ER notes  EKG:  EKG is personally reviewed.  The ekg ordered today demonstrates normal sinus rhythm with sinus arrhythmia, HR 78 bpm.  Recent Labs: 05/14/2018: BUN 19; Creatinine, Ser 0.89; Hemoglobin 14.9; Platelets 353; Potassium 3.9; Sodium 136; TSH 2.801  Recent Lipid Panel No results found for: CHOL, TRIG, HDL, CHOLHDL, VLDL, LDLCALC, LDLDIRECT  Physical Exam:    VS:  BP 110/68   Pulse 78   Ht 5\' 1"  (1.549 m)   Wt 201 lb 6.4 oz (91.4 kg)   BMI 38.05 kg/m     Wt Readings from Last 3 Encounters:  05/17/18 201 lb 6.4 oz (91.4 kg)  03/12/17 216 lb (98 kg)     GEN: Well nourished, well developed in no acute distress HEENT: Normal NECK: No JVD; No carotid bruits LYMPHATICS: No lymphadenopathy CARDIAC: regular rhythm, normal S1 and S2, no murmurs, rubs, gallops. Radial and DP pulses 2+ bilaterally. She has point tenderness and palpable muscle knot over left upper pectoral region. RESPIRATORY:  Clear to auscultation without rales, wheezing or rhonchi  ABDOMEN: Soft, non-tender, non-distended MUSCULOSKELETAL:  No edema; No deformity  SKIN: Warm and dry NEUROLOGIC:  Alert and oriented x 3 PSYCHIATRIC:  Normal affect   ASSESSMENT:    1. Chest pain, unspecified type   2. Palpitation   3. Family history of heart disease   4. Counseling on health promotion and disease prevention   5. Cardiac risk counseling    PLAN:     Chest pain: low CV risk given age, lack of comorbidities. Point tenderness on exam and atypical features. Low suspicion for cardiac etiology.  Palpitations: notes that she had symptoms while in ER but did not note any abnormalities on telemetry. Will pursue 48 hour Holter, instructed on how to mark symptoms. If Holter unremarkable, no further workup. Has already cut back on caffeine, no OTC supplements.  Family history of heart disease: her father, who had diabetes, and high cholesterol prior to having sudden MI in 2018-03-08, passed away from it at age 53. This has understandably caused her a lot of stress recently. We discussed prevention, below.  Cardiovascular risk factors and prevention counseling: -recommend heart healthy/Mediterranean diet, with whole grains, fruits, vegetable, fish, lean meats, nuts, and olive oil. Limit salt. -recommend moderate walking, 3-5 times/week for 30-50 minutes each session. Aim for at  least 150 minutes.week. Goal should be pace of 3 miles/hours, or walking 1.5 miles in 30 minutes -recommend avoidance of tobacco products. Avoid excess alcohol. -Additional risk factor control:  -Diabetes: no personal history, but has family history, will screen today  -Lipids: will screen today, no prior available  -Blood pressure control: at goal  -Weight: BMI 38, gets a lot of activity, would benefit in the long term from gradual weight loss. -ASCVD risk score: not available yet (no lipids) -counseled on importance of establishing care with PCP for long term prevention  Plan for follow up: likely as needed, TBD based on results of testing  Medication Adjustments/Labs and Tests Ordered: Current medicines are reviewed at length with the patient today.  Concerns regarding medicines are outlined above.  Orders Placed This Encounter  Procedures  . Lipid panel  . Hemoglobin A1c  . HOLTER MONITOR - 48 HOUR   No orders of the defined types were placed in this  encounter.   Patient Instructions  Medication Instructions:  Your Physician recommend you continue on your current medication as directed.     If you need a refill on your cardiac medications before your next appointment, please call your pharmacy.   Lab work: Your physician recommends that you return for lab work today (lipid, A1C).   Testing/Procedures: Your physician has recommended that you wear a 48 hour holter monitor. Holter monitors are medical devices that record the heart's electrical activity. Doctors most often use these monitors to diagnose arrhythmias. Arrhythmias are problems with the speed or rhythm of the heartbeat. The monitor is a small, portable device. You can wear one while you do your normal daily activities. This is usually used to diagnose what is causing palpitations/syncope (passing out). [address]      Follow-Up: At [facility], you and your health needs are our priority.  As part of our continuing mission to provide you with exceptional heart care, we have created designated Provider Care Teams.  These Care Teams include your primary Cardiologist (physician) and Advanced Practice Providers (APPs -  Physician Assistants and Nurse Practitioners) who all work together to provide you with the care you need, when you need it. You will need a follow up appointment as needed.  Please call our office 2 months in advance to schedule this appointment.  You may see Dr. Cristal Deer or one of the following Advanced Practice Providers on your designated Care Team:   Theodore Demark, PA-C . Joni Reining, DNP, ANP       Signed, Jodelle Red, MD PhD 05/17/2018 9:38 AM    [facility]

## 2018-05-18 LAB — LIPID PANEL
Chol/HDL Ratio: 2.8 ratio (ref 0.0–4.4)
Cholesterol, Total: 165 mg/dL (ref 100–199)
HDL: 60 mg/dL (ref >39)
LDL Calculated: 90 mg/dL (ref 0–99)
Triglycerides: 73 mg/dL (ref 0–149)
VLDL Cholesterol Cal: 15 mg/dL (ref 5–40)

## 2018-05-18 LAB — HEMOGLOBIN A1C
Est. average glucose Bld gHb Est-mCnc: 105 mg/dL
Hgb A1c MFr Bld: 5.3 % (ref 4.8–5.6)

## 2018-05-19 NOTE — Addendum Note (Signed)
Addended by: Winn Jock on: 05/19/2018 04:31 PM   Modules accepted: Orders

## 2018-06-13 ENCOUNTER — Telehealth: Payer: Self-pay | Admitting: Cardiology

## 2018-06-13 NOTE — Telephone Encounter (Signed)
F/U Message         Patient is calling back to answer the question about the "Holtor Monitor being mailed to her house. Patient would like a call back.

## 2018-06-13 NOTE — Telephone Encounter (Signed)
Spoke with Sonya Plater., in our monitor dept prior to calling the pt back. Due to COVID-19 protical the pt holter monitor appt for 06/14/18 will be cancelled and a ziopatch monitor will be mailed to her home. The address listed for the pt is a p.o. box. A physical address is need in order for the monitor to be called.   Contacted the pt and made her aware if the info above. Pt ask the the monitor be mailed to [address] [name], [place] [address].  Adv the pt that I will fwd the info to the monitor dept. Pt verbalized understanding.

## 2018-06-21 ENCOUNTER — Ambulatory Visit (INDEPENDENT_AMBULATORY_CARE_PROVIDER_SITE_OTHER): Payer: Medicaid Other

## 2018-06-21 DIAGNOSIS — R002 Palpitations: Secondary | ICD-10-CM | POA: Diagnosis not present

## 2018-06-21 DIAGNOSIS — R079 Chest pain, unspecified: Secondary | ICD-10-CM | POA: Diagnosis not present

## 2018-07-04 ENCOUNTER — Other Ambulatory Visit: Payer: Self-pay

## 2018-07-18 NOTE — Progress Notes (Signed)
Can you let Ms Puch know that there were no significant rhythm issues on her monitor? Her events were sinus rhythm or sinus tach (at a low rate, just around 100 bpm) which is normal. Two of the events were single PVCs, but these are normal (occur less than 1% of the time) and benign. Nothing that needs to be treated. Thanks!

## 2020-02-02 IMAGING — DX DG CHEST 2V
2 series · 2 of 2 positions shown · non-contrast
Comparison: 07/15/2010

CLINICAL DATA: Chest pain

EXAM:
CHEST - 2 VIEW

[chest pa]
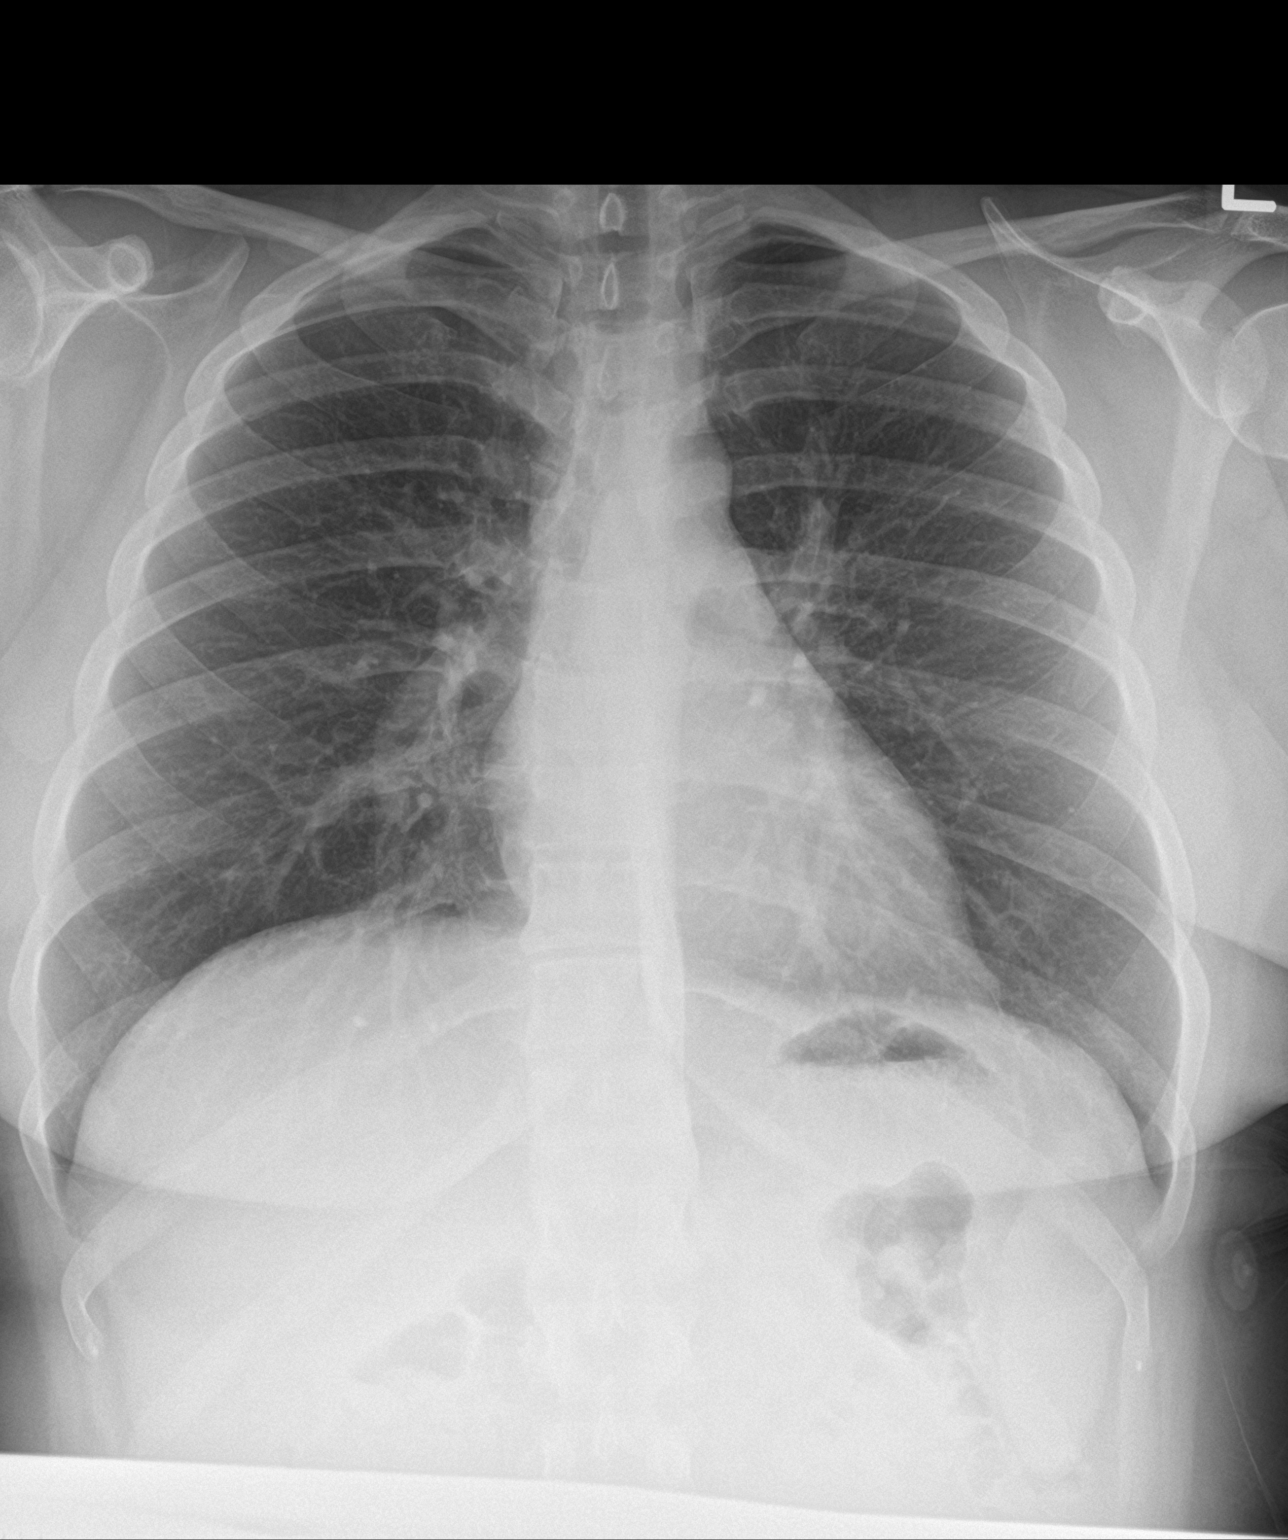

[chest lat]
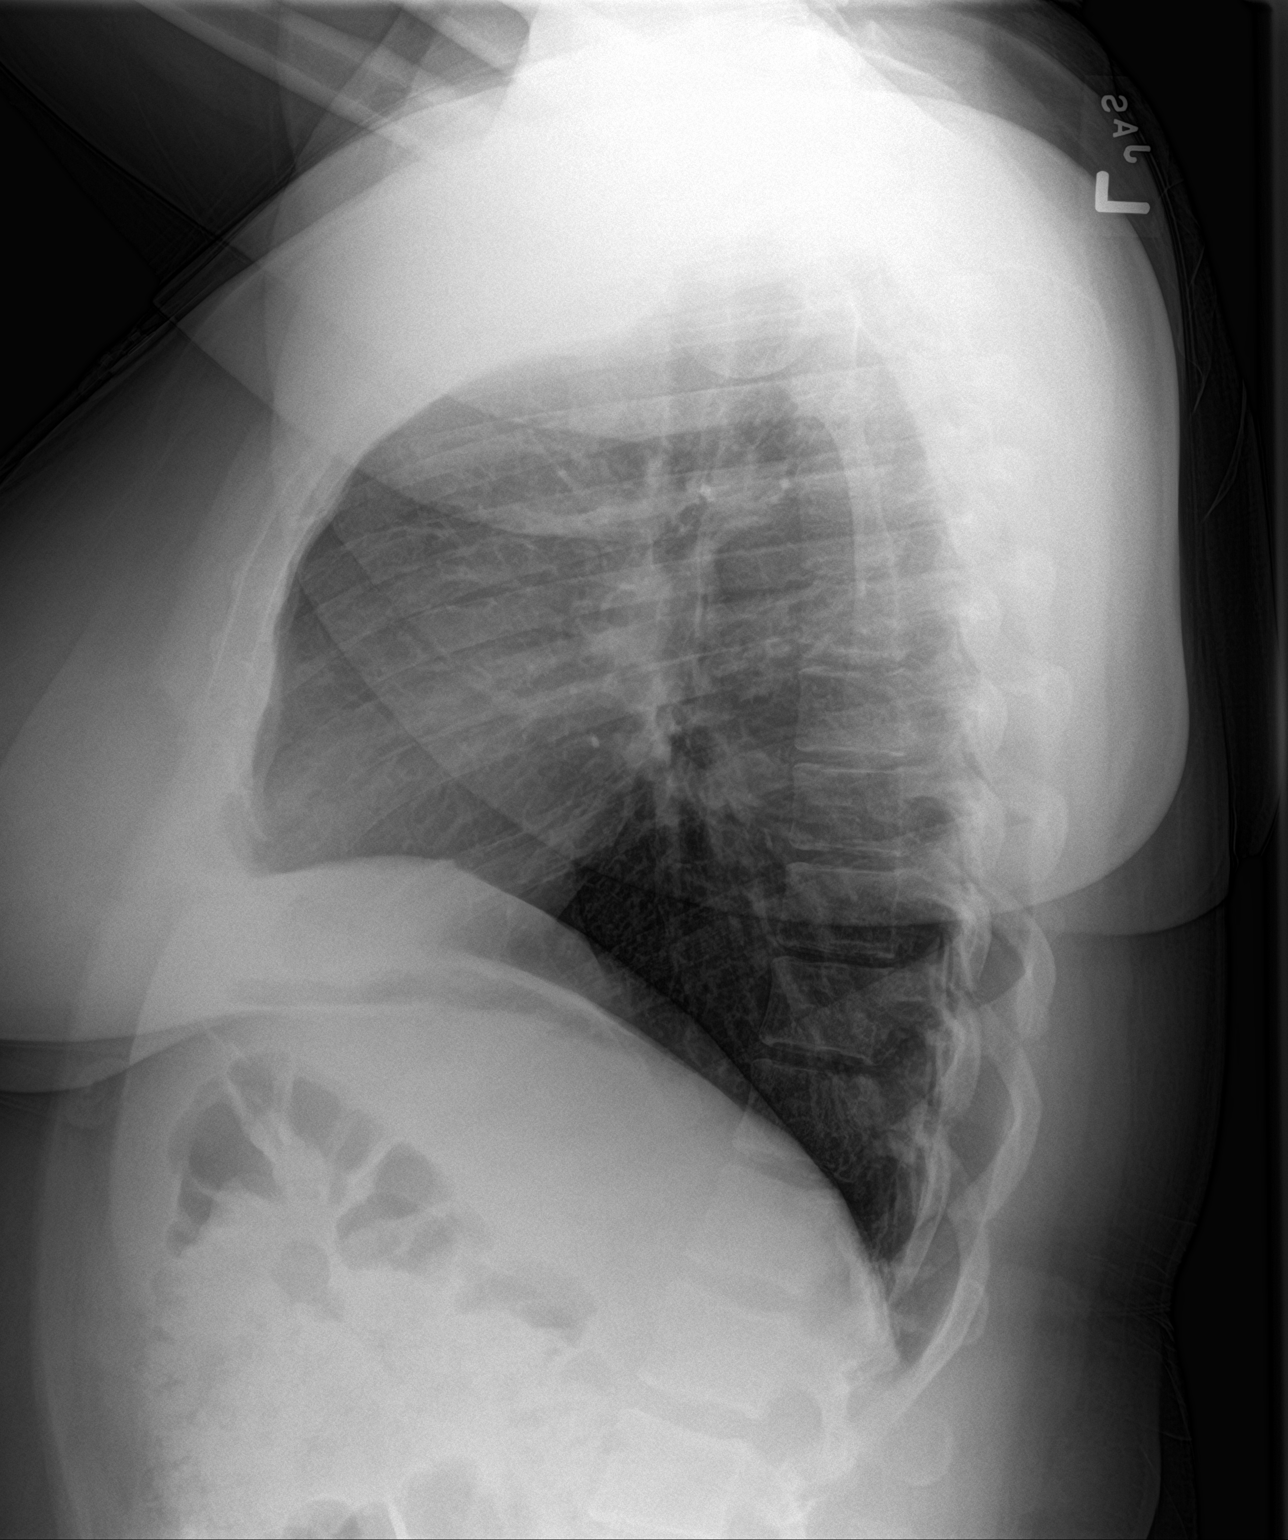

[2 of 2 positions shown; findings below may reference images not displayed]

FINDINGS: The heart size and mediastinal contours are within normal limits.
Both lungs are clear. The visualized skeletal structures are
unremarkable.
IMPRESSION: No active cardiopulmonary disease.

## 2020-11-05 ENCOUNTER — Other Ambulatory Visit: Payer: Self-pay | Admitting: Physician Assistant

## 2020-11-05 DIAGNOSIS — M5414 Radiculopathy, thoracic region: Secondary | ICD-10-CM

## 2021-09-28 LAB — OB RESULTS CONSOLE ANTIBODY SCREEN: Antibody Screen: NEGATIVE

## 2021-09-28 LAB — OB RESULTS CONSOLE HEPATITIS B SURFACE ANTIGEN: Hepatitis B Surface Ag: NEGATIVE

## 2021-09-28 LAB — HEPATITIS C ANTIBODY: HCV Ab: NEGATIVE

## 2021-09-28 LAB — OB RESULTS CONSOLE RUBELLA ANTIBODY, IGM: Rubella: IMMUNE

## 2021-09-28 LAB — OB RESULTS CONSOLE GC/CHLAMYDIA
Chlamydia: NEGATIVE
Neisseria Gonorrhea: NEGATIVE

## 2021-09-28 LAB — OB RESULTS CONSOLE ABO/RH: RH Type: NEGATIVE

## 2021-09-28 LAB — OB RESULTS CONSOLE HIV ANTIBODY (ROUTINE TESTING): HIV: NONREACTIVE

## 2021-09-28 LAB — OB RESULTS CONSOLE RPR: RPR: NONREACTIVE

## 2022-03-22 NOTE — L&D Delivery Note (Signed)
DELIVERY NOTE  Pt complete and at +2 station with urge to push. Epidural controlling pain. Pt pushed and delivered a viable female infant in LOA position. Anterior and posterior shoulders spontaneously delivered with next two pushes; body easily followed next. Infant placed on mothers abdomen and bulb suction of mouth and nose performed. Cord was then clamped and cut by FOB. Cord blood obtained, 3VC. Baby had a vigorous spontaneous cry noted. Placenta then delivered at [facility] intact. Fundal massage performed and pitocin per protocol. Fundus firm. The following lacerations were noted: perineal abrasion, hemostatic, EBL 351cc. Mother and baby stable. Counts correct   Infant time: 1843 Gender: female, desires circ Placenta time: 1847 Apgars: 9/9 Weight: pending skin-to-skin

## 2022-04-08 LAB — OB RESULTS CONSOLE GBS: GBS: POSITIVE

## 2022-04-24 ENCOUNTER — Inpatient Hospital Stay (HOSPITAL_COMMUNITY)
Admission: AD | Admit: 2022-04-24 | Discharge: 2022-04-24 | Disposition: A | Discharge status: Home / Self Care | Payer: Medicaid Other | Attending: Obstetrics and Gynecology | Admitting: Obstetrics and Gynecology

## 2022-04-24 ENCOUNTER — Inpatient Hospital Stay (HOSPITAL_COMMUNITY): Payer: Medicaid Other

## 2022-04-24 ENCOUNTER — Encounter (HOSPITAL_COMMUNITY): Payer: Self-pay | Admitting: Obstetrics and Gynecology

## 2022-04-24 DIAGNOSIS — K8020 Calculus of gallbladder without cholecystitis without obstruction: Secondary | ICD-10-CM | POA: Insufficient documentation

## 2022-04-24 DIAGNOSIS — O26893 Other specified pregnancy related conditions, third trimester: Secondary | ICD-10-CM | POA: Insufficient documentation

## 2022-04-24 DIAGNOSIS — O26613 Liver and biliary tract disorders in pregnancy, third trimester: Secondary | ICD-10-CM | POA: Insufficient documentation

## 2022-04-24 DIAGNOSIS — Z3A38 38 weeks gestation of pregnancy: Secondary | ICD-10-CM | POA: Diagnosis not present

## 2022-04-24 DIAGNOSIS — Z3689 Encounter for other specified antenatal screening: Secondary | ICD-10-CM | POA: Insufficient documentation

## 2022-04-24 DIAGNOSIS — R1011 Right upper quadrant pain: Secondary | ICD-10-CM | POA: Insufficient documentation

## 2022-04-24 LAB — CBC WITH DIFFERENTIAL/PLATELET
Abs Immature Granulocytes: 0.04 10*3/uL (ref 0.00–0.07)
Basophils Absolute: 0.1 10*3/uL (ref 0.0–0.1)
Basophils Relative: 1 %
Eosinophils Absolute: 0.2 10*3/uL (ref 0.0–0.5)
Eosinophils Relative: 2 %
HCT: 35.6 % — ABNORMAL LOW (ref 36.0–46.0)
Hemoglobin: 12.7 g/dL (ref 12.0–15.0)
Immature Granulocytes: 0 %
Lymphocytes Relative: 33 %
Lymphs Abs: 3.1 10*3/uL (ref 0.7–4.0)
MCH: 30.8 pg (ref 26.0–34.0)
MCHC: 35.7 g/dL (ref 30.0–36.0)
MCV: 86.2 fL (ref 80.0–100.0)
Monocytes Absolute: 0.5 10*3/uL (ref 0.1–1.0)
Monocytes Relative: 5 %
Neutro Abs: 5.4 10*3/uL (ref 1.7–7.7)
Neutrophils Relative %: 59 %
Platelets: 232 10*3/uL (ref 150–400)
RBC: 4.13 MIL/uL (ref 3.87–5.11)
RDW: 13.1 % (ref 11.5–15.5)
WBC: 9.2 10*3/uL (ref 4.0–10.5)
nRBC: 0 % (ref 0.0–0.2)

## 2022-04-24 LAB — COMPREHENSIVE METABOLIC PANEL
ALT: 20 U/L (ref 0–44)
AST: 24 U/L (ref 15–41)
Albumin: 2.7 g/dL — ABNORMAL LOW (ref 3.5–5.0)
Alkaline Phosphatase: 111 U/L (ref 38–126)
Anion gap: 8 (ref 5–15)
BUN: 5 mg/dL — ABNORMAL LOW (ref 6–20)
CO2: 23 mmol/L (ref 22–32)
Calcium: 8.9 mg/dL (ref 8.9–10.3)
Chloride: 104 mmol/L (ref 98–111)
Creatinine, Ser: 0.65 mg/dL (ref 0.44–1.00)
GFR, Estimated: >60 mL/min (ref >60)
Glucose, Bld: 90 mg/dL (ref 70–99)
Potassium: 4 mmol/L (ref 3.5–5.1)
Sodium: 135 mmol/L (ref 135–145)
Total Bilirubin: 0.3 mg/dL (ref 0.3–1.2)
Total Protein: 6 g/dL — ABNORMAL LOW (ref 6.5–8.1)

## 2022-04-24 NOTE — MAU Note (Signed)
Pt says she is she for an induction 04-30-2022 No UC's Thinks she is having gall bladder pain- pain started at 10pm- upper right quad- wrapped to her back.   ( Had an episode  1 mth ago- that lasted 30 min )  Has not had an U/S to confirm  [facility]- Dr Terri Piedra- she has not told Dr about this

## 2022-04-24 NOTE — MAU Provider Note (Signed)
History     CSN: [id number]  Arrival date and time: 04/24/22 0156   Event Date/Time   First Provider Initiated Contact with Patient 04/24/22 0324      No chief complaint on file.  HPI Sonya [place] is a 32 y.o. G2P1001 at 107w3d who receives care at [facility].  She has an appt scheduled for Feb 8th and is for IOL on Feb 9th.  She presents today for RUQ pain that occurred around 10pm. Patient feels that she may have gallstones.  She describes it as a stabbing pain that radiated to her mid to upper back.  She reports it was constant and lasted about 4-5 hours.  She states she took Tums for heartburn at the time.  She further reports she has had a previous incident, about one month ago, that lasted about 30 minutes.  She states the pain was "pretty severe" earlier, but now it has "eased off."  She denies a history of gallstones and has not had a formal evaluation . She endorses fetal movement and denies vaginal concerns.  Dinner: Joylene Draft Lunch: [employer]; 4 chicken nuggets Breakfast: None No Snacks.   OB History     Gravida  2   Para  1   Term  1   Preterm      AB      Living  1      SAB      IAB      Ectopic      Multiple  0   Live Births  1           Past Medical History:  Diagnosis Date   Asthma     History reviewed. No pertinent surgical history.  Family History  Problem Relation Age of Onset   Diabetes Father    Diabetes Maternal Grandmother     Social History   Tobacco Use   Smoking status: Never   Smokeless tobacco: Never  Substance Use Topics   Alcohol use: No   Drug use: No    Allergies:  Allergies  Allergen Reactions   Shellfish Allergy Hives    Medications Prior to Admission  Medication Sig Dispense Refill Last Dose   ibuprofen (ADVIL,MOTRIN) 600 MG tablet Take 1 tablet (600 mg total) by mouth every 6 (six) hours. 30 tablet 0 More than a month   Prenatal Vit-Fe Fumarate-FA (PRENATAL MULTIVITAMIN) TABS tablet Take  1 tablet by mouth daily at 12 noon. 100 tablet 3 04/12/2022    Review of Systems  Constitutional:  Negative for chills and fever.  Gastrointestinal:  Positive for abdominal pain. Negative for diarrhea, nausea and vomiting.  Genitourinary:  Positive for flank pain. Negative for difficulty urinating, dysuria, pelvic pain (Pressure), vaginal bleeding and vaginal discharge.  Neurological:  Negative for dizziness, light-headedness and headaches.   Physical Exam   Blood pressure 124/84, pulse 83, temperature 98.8 F (37.1 C), temperature source Oral, resp. rate 18, height 5\' 1"  (1.549 m), weight 106.4 kg, unknown if currently breastfeeding.  Physical Exam Vitals reviewed.  HENT:     Head: Normocephalic and atraumatic.  Eyes:     Conjunctiva/sclera: Conjunctivae normal.  Cardiovascular:     Rate and Rhythm: Normal rate.  Pulmonary:     Effort: No respiratory distress.  Abdominal:     Tenderness: There is no abdominal tenderness.     Comments: Gravid, Appears AGA  Musculoskeletal:        General: Normal range of motion.  Cervical back: Normal range of motion.  Skin:    General: Skin is warm and dry.  Neurological:     Mental Status: She is alert and oriented to person, place, and time.  Psychiatric:        Mood and Affect: Mood normal.        Behavior: Behavior normal.     Fetal Assessment 140 bpm, Mod Var, -Decels, +Accels Toco: Irregular Ctx; 5-10 min  MAU Course   Results for orders placed or performed during the hospital encounter of 04/24/22 (from the past 24 hour(s))  CBC with Differential/Platelet     Status: Abnormal   Collection Time: 04/24/22  3:50 AM  Result Value Ref Range   WBC 9.2 4.0 - 10.5 K/uL   RBC 4.13 3.87 - 5.11 MIL/uL   Hemoglobin 12.7 12.0 - 15.0 g/dL   HCT 35.6 (L) 36.0 - 46.0 %   MCV 86.2 80.0 - 100.0 fL   MCH 30.8 26.0 - 34.0 pg   MCHC 35.7 30.0 - 36.0 g/dL   RDW 13.1 11.5 - 15.5 %   Platelets 232 150 - 400 K/uL   nRBC 0.0 0.0 - 0.2 %    Neutrophils Relative % 59 %   Neutro Abs 5.4 1.7 - 7.7 K/uL   Lymphocytes Relative 33 %   Lymphs Abs 3.1 0.7 - 4.0 K/uL   Monocytes Relative 5 %   Monocytes Absolute 0.5 0.1 - 1.0 K/uL   Eosinophils Relative 2 %   Eosinophils Absolute 0.2 0.0 - 0.5 K/uL   Basophils Relative 1 %   Basophils Absolute 0.1 0.0 - 0.1 K/uL   Immature Granulocytes 0 %   Abs Immature Granulocytes 0.04 0.00 - 0.07 K/uL  Comprehensive metabolic panel     Status: Abnormal   Collection Time: 04/24/22  3:50 AM  Result Value Ref Range   Sodium 135 135 - 145 mmol/L   Potassium 4.0 3.5 - 5.1 mmol/L   Chloride 104 98 - 111 mmol/L   CO2 23 22 - 32 mmol/L   Glucose, Bld 90 70 - 99 mg/dL   BUN 5 (L) 6 - 20 mg/dL   Creatinine, Ser 0.65 0.44 - 1.00 mg/dL   Calcium 8.9 8.9 - 10.3 mg/dL   Total Protein 6.0 (L) 6.5 - 8.1 g/dL   Albumin 2.7 (L) 3.5 - 5.0 g/dL   AST 24 15 - 41 U/L   ALT 20 0 - 44 U/L   Alkaline Phosphatase 111 38 - 126 U/L   Total Bilirubin 0.3 0.3 - 1.2 mg/dL   GFR, Estimated >60 >60 mL/min   Anion gap 8 5 - 15   US ABDOMEN LIMITED RUQ (LIVER/GB)  Result Date: 04/24/2022 CLINICAL DATA:  Right upper quadrant pain. EXAM: ULTRASOUND ABDOMEN LIMITED RIGHT UPPER QUADRANT COMPARISON:  None Available. FINDINGS: Gallbladder: Stones are present within the gallbladder. There is no gallbladder wall thickening or pericholecystic fluid. No sonographic Murphy sign noted by sonographer. Common bile duct: Diameter: 2.5 mm Liver: No focal lesion identified. Within normal limits in parenchymal echogenicity. Portal vein is patent on color Doppler imaging with normal direction of blood flow towards the liver. Other: No free fluid. IMPRESSION: Cholelithiasis without acute cholecystitis. Electronically Signed   By: Brett Fairy M.D.   On: 04/24/2022 04:37    MDM PE Labs: CBC/D, CMP EFM Ultrasound Assessment and Plan  32 year old G2P1001  SIUP at 38.3 weeks Cat I FT RUQ Pain  -POC Reviewed -Exam performed. -Patient  offered and  declines pain medication.  -Labs ordered. -Will send for [place] and await results.  -NST Reactive   Maryann Conners MSN, CNM 04/24/2022, 3:24 AM   Reassessment (4:50 AM) -Patient results as above.  -Provider to bedside to discuss findings. -Informed patient that her suspicions are valid. -Reviewed dietary changes and return precautions. -Follow up as scheduled. -Encouraged to call primary office or return to MAU if symptoms worsen or with the onset of new symptoms. -Discharged to home in stable condition.  Maryann Conners MSN, CNM Advanced Practice Provider, [facility]

## 2022-04-29 ENCOUNTER — Encounter (HOSPITAL_COMMUNITY): Payer: Self-pay | Admitting: *Deleted

## 2022-04-29 ENCOUNTER — Other Ambulatory Visit: Payer: Self-pay | Admitting: Obstetrics and Gynecology

## 2022-04-29 ENCOUNTER — Telehealth (HOSPITAL_COMMUNITY): Payer: Self-pay | Admitting: *Deleted

## 2022-04-29 DIAGNOSIS — Z3A39 39 weeks gestation of pregnancy: Secondary | ICD-10-CM

## 2022-04-29 NOTE — Telephone Encounter (Signed)
Preadmission screen  

## 2022-04-30 ENCOUNTER — Other Ambulatory Visit: Payer: Self-pay

## 2022-04-30 ENCOUNTER — Inpatient Hospital Stay (HOSPITAL_COMMUNITY)
Admission: RE | Admit: 2022-04-30 | Discharge: 2022-05-02 | DRG: 807 | Disposition: A | Discharge status: Home / Self Care | Payer: Medicaid Other | Attending: Obstetrics and Gynecology | Admitting: Obstetrics and Gynecology

## 2022-04-30 ENCOUNTER — Encounter (HOSPITAL_COMMUNITY): Payer: Self-pay | Admitting: Obstetrics and Gynecology

## 2022-04-30 ENCOUNTER — Inpatient Hospital Stay (HOSPITAL_COMMUNITY): Payer: Medicaid Other | Admitting: Anesthesiology

## 2022-04-30 ENCOUNTER — Inpatient Hospital Stay (HOSPITAL_COMMUNITY): Payer: Medicaid Other

## 2022-04-30 DIAGNOSIS — J45909 Unspecified asthma, uncomplicated: Secondary | ICD-10-CM | POA: Diagnosis present

## 2022-04-30 DIAGNOSIS — Z3A39 39 weeks gestation of pregnancy: Principal | ICD-10-CM

## 2022-04-30 DIAGNOSIS — O26893 Other specified pregnancy related conditions, third trimester: Principal | ICD-10-CM | POA: Diagnosis present

## 2022-04-30 DIAGNOSIS — K8020 Calculus of gallbladder without cholecystitis without obstruction: Secondary | ICD-10-CM | POA: Diagnosis present

## 2022-04-30 DIAGNOSIS — O9962 Diseases of the digestive system complicating childbirth: Secondary | ICD-10-CM | POA: Diagnosis present

## 2022-04-30 DIAGNOSIS — O9952 Diseases of the respiratory system complicating childbirth: Secondary | ICD-10-CM | POA: Diagnosis present

## 2022-04-30 DIAGNOSIS — Z6791 Unspecified blood type, Rh negative: Secondary | ICD-10-CM | POA: Diagnosis not present

## 2022-04-30 DIAGNOSIS — O99214 Obesity complicating childbirth: Secondary | ICD-10-CM | POA: Diagnosis present

## 2022-04-30 DIAGNOSIS — O99824 Streptococcus B carrier state complicating childbirth: Secondary | ICD-10-CM | POA: Diagnosis present

## 2022-04-30 LAB — TYPE AND SCREEN
ABO/RH(D): A NEG
Antibody Screen: POSITIVE

## 2022-04-30 LAB — CBC
HCT: 35.7 % — ABNORMAL LOW (ref 36.0–46.0)
Hemoglobin: 12.8 g/dL (ref 12.0–15.0)
MCH: 30.8 pg (ref 26.0–34.0)
MCHC: 35.9 g/dL (ref 30.0–36.0)
MCV: 85.8 fL (ref 80.0–100.0)
Platelets: 253 10*3/uL (ref 150–400)
RBC: 4.16 MIL/uL (ref 3.87–5.11)
RDW: 13.1 % (ref 11.5–15.5)
WBC: 9 10*3/uL (ref 4.0–10.5)
nRBC: 0 % (ref 0.0–0.2)

## 2022-04-30 LAB — RPR: RPR Ser Ql: NONREACTIVE

## 2022-04-30 MED ORDER — EPHEDRINE 5 MG/ML INJ: 10.0000 mg | INTRAVENOUS | Status: DC | PRN

## 2022-04-30 MED ORDER — OXYTOCIN BOLUS FROM INFUSION
333.0000 mL | Freq: Once | INTRAVENOUS | Status: AC
  Administered 2022-04-30: 333 mL via INTRAVENOUS

## 2022-04-30 MED ORDER — ACETAMINOPHEN 325 MG PO TABS
650.0000 mg | ORAL_TABLET | ORAL | Status: DC | PRN
  Administered 2022-04-30 – 2022-05-02 (×6): 650 mg via ORAL
  Filled 2022-04-30 (×6): qty 2

## 2022-04-30 MED ORDER — OXYCODONE-ACETAMINOPHEN 5-325 MG PO TABS: 2.0000 | ORAL_TABLET | ORAL | Status: DC | PRN

## 2022-04-30 MED ORDER — ACETAMINOPHEN 325 MG PO TABS: 650.0000 mg | ORAL_TABLET | ORAL | Status: DC | PRN

## 2022-04-30 MED ORDER — ZOLPIDEM TARTRATE 5 MG PO TABS: 5.0000 mg | ORAL_TABLET | Freq: Every evening | ORAL | Status: DC | PRN

## 2022-04-30 MED ORDER — DIPHENHYDRAMINE HCL 50 MG/ML IJ SOLN: 12.5000 mg | INTRAMUSCULAR | Status: DC | PRN

## 2022-04-30 MED ORDER — ONDANSETRON HCL 4 MG PO TABS: 4.0000 mg | ORAL_TABLET | ORAL | Status: DC | PRN

## 2022-04-30 MED ORDER — COCONUT OIL OIL: 1.0000 | TOPICAL_OIL | Status: DC | PRN

## 2022-04-30 MED ORDER — OXYTOCIN-SODIUM CHLORIDE 30-0.9 UT/500ML-% IV SOLN
1.0000 m[IU]/min | INTRAVENOUS | Status: DC
  Administered 2022-04-30: 2 m[IU]/min via INTRAVENOUS
  Filled 2022-04-30: qty 500

## 2022-04-30 MED ORDER — PRENATAL MULTIVITAMIN CH
1.0000 | ORAL_TABLET | Freq: Every day | ORAL | Status: DC
  Administered 2022-05-01: 1 via ORAL
  Filled 2022-04-30: qty 1

## 2022-04-30 MED ORDER — FENTANYL-BUPIVACAINE-NACL 0.5-0.125-0.9 MG/250ML-% EP SOLN
12.0000 mL/h | EPIDURAL | Status: DC | PRN
  Administered 2022-04-30: 10.5 mL/h via EPIDURAL
  Filled 2022-04-30: qty 250

## 2022-04-30 MED ORDER — LIDOCAINE HCL (PF) 1 % IJ SOLN
INTRAMUSCULAR | Status: DC | PRN
  Administered 2022-04-30 (×2): 4 mL via EPIDURAL

## 2022-04-30 MED ORDER — IBUPROFEN 600 MG PO TABS
600.0000 mg | ORAL_TABLET | Freq: Four times a day (QID) | ORAL | Status: DC
  Filled 2022-04-30 (×3): qty 1

## 2022-04-30 MED ORDER — TETANUS-DIPHTH-ACELL PERTUSSIS 5-2.5-18.5 LF-MCG/0.5 IM SUSY: 0.5000 mL | PREFILLED_SYRINGE | Freq: Once | INTRAMUSCULAR | Status: DC

## 2022-04-30 MED ORDER — SIMETHICONE 80 MG PO CHEW: 80.0000 mg | CHEWABLE_TABLET | ORAL | Status: DC | PRN

## 2022-04-30 MED ORDER — BENZOCAINE-MENTHOL 20-0.5 % EX AERO: 1.0000 | INHALATION_SPRAY | CUTANEOUS | Status: DC | PRN

## 2022-04-30 MED ORDER — DIPHENHYDRAMINE HCL 25 MG PO CAPS
25.0000 mg | ORAL_CAPSULE | Freq: Four times a day (QID) | ORAL | Status: DC | PRN
  Administered 2022-05-01: 25 mg via ORAL
  Filled 2022-04-30: qty 1

## 2022-04-30 MED ORDER — OXYCODONE-ACETAMINOPHEN 5-325 MG PO TABS: 1.0000 | ORAL_TABLET | ORAL | Status: DC | PRN

## 2022-04-30 MED ORDER — OXYTOCIN-SODIUM CHLORIDE 30-0.9 UT/500ML-% IV SOLN: 2.5000 [IU]/h | INTRAVENOUS | Status: DC

## 2022-04-30 MED ORDER — LIDOCAINE HCL (PF) 1 % IJ SOLN: 30.0000 mL | INTRAMUSCULAR | Status: DC | PRN

## 2022-04-30 MED ORDER — ONDANSETRON HCL 4 MG/2ML IJ SOLN: 4.0000 mg | INTRAMUSCULAR | Status: DC | PRN

## 2022-04-30 MED ORDER — PENICILLIN G POT IN DEXTROSE 60000 UNIT/ML IV SOLN
3.0000 10*6.[IU] | INTRAVENOUS | Status: DC
  Administered 2022-04-30 (×2): 3 10*6.[IU] via INTRAVENOUS
  Filled 2022-04-30 (×2): qty 50

## 2022-04-30 MED ORDER — SODIUM CHLORIDE 0.9 % IV SOLN
5.0000 10*6.[IU] | Freq: Once | INTRAVENOUS | Status: AC
  Administered 2022-04-30: 5 10*6.[IU] via INTRAVENOUS
  Filled 2022-04-30: qty 5

## 2022-04-30 MED ORDER — ONDANSETRON HCL 4 MG/2ML IJ SOLN: 4.0000 mg | Freq: Four times a day (QID) | INTRAMUSCULAR | Status: DC | PRN

## 2022-04-30 MED ORDER — SOD CITRATE-CITRIC ACID 500-334 MG/5ML PO SOLN
30.0000 mL | ORAL | Status: DC | PRN
  Administered 2022-04-30: 30 mL via ORAL
  Filled 2022-04-30: qty 30

## 2022-04-30 MED ORDER — DIBUCAINE (PERIANAL) 1 % EX OINT: 1.0000 | TOPICAL_OINTMENT | CUTANEOUS | Status: DC | PRN

## 2022-04-30 MED ORDER — PHENYLEPHRINE 80 MCG/ML (10ML) SYRINGE FOR IV PUSH (FOR BLOOD PRESSURE SUPPORT)
80.0000 ug | PREFILLED_SYRINGE | INTRAVENOUS | Status: DC | PRN
  Filled 2022-04-30: qty 10

## 2022-04-30 MED ORDER — FENTANYL CITRATE (PF) 100 MCG/2ML IJ SOLN: 50.0000 ug | INTRAMUSCULAR | Status: DC | PRN

## 2022-04-30 MED ORDER — SENNOSIDES-DOCUSATE SODIUM 8.6-50 MG PO TABS
2.0000 | ORAL_TABLET | Freq: Every day | ORAL | Status: DC
  Administered 2022-05-01: 2 via ORAL
  Filled 2022-04-30: qty 2

## 2022-04-30 MED ORDER — TERBUTALINE SULFATE 1 MG/ML IJ SOLN: 0.2500 mg | Freq: Once | INTRAMUSCULAR | Status: DC | PRN

## 2022-04-30 MED ORDER — PHENYLEPHRINE 80 MCG/ML (10ML) SYRINGE FOR IV PUSH (FOR BLOOD PRESSURE SUPPORT): 80.0000 ug | PREFILLED_SYRINGE | INTRAVENOUS | Status: DC | PRN

## 2022-04-30 MED ORDER — WITCH HAZEL-GLYCERIN EX PADS: 1.0000 | MEDICATED_PAD | CUTANEOUS | Status: DC | PRN

## 2022-04-30 MED ORDER — LACTATED RINGERS IV SOLN: INTRAVENOUS | Status: DC

## 2022-04-30 MED ORDER — LACTATED RINGERS IV SOLN: 500.0000 mL | INTRAVENOUS | Status: DC | PRN

## 2022-04-30 MED ORDER — LACTATED RINGERS IV SOLN
500.0000 mL | Freq: Once | INTRAVENOUS | Status: AC
  Administered 2022-04-30: 500 mL via INTRAVENOUS

## 2022-04-30 MED ORDER — PRENATAL MULTIVITAMIN CH: 1.0000 | ORAL_TABLET | Freq: Every day | ORAL | Status: DC

## 2022-04-30 NOTE — Anesthesia Procedure Notes (Signed)
Epidural Patient location during procedure: OB Start time: 04/30/2022 1:15 PM End time: 04/30/2022 1:23 PM  Staffing Anesthesiologist: Josephine Igo, MD Performed: anesthesiologist   Preanesthetic Checklist Completed: patient identified, IV checked, site marked, risks and benefits discussed, surgical consent, monitors and equipment checked, pre-op evaluation and timeout performed  Epidural Patient position: sitting Prep: DuraPrep and site prepped and draped Patient monitoring: continuous pulse ox and blood pressure Approach: midline Location: L3-L4 Injection technique: LOR air  Needle:  Needle type: Tuohy  Needle gauge: 17 G Needle length: 9 cm and 9 Needle insertion depth: 7 cm Catheter type: closed end flexible Catheter size: 19 Gauge Catheter at skin depth: 12 cm Test dose: negative and Other  Assessment Events: blood not aspirated, no cerebrospinal fluid, injection not painful, no injection resistance, no paresthesia and negative IV test  Additional Notes Patient identified. Risks and benefits discussed including failed block, incomplete  Pain control, post dural puncture headache, nerve damage, paralysis, blood pressure Changes, nausea, vomiting, reactions to medications-both toxic and allergic and post Partum back pain. All questions were answered. Patient expressed understanding and wished to proceed. Sterile technique was used throughout procedure. Epidural site was Dressed with sterile barrier dressing. No paresthesias, signs of intravascular injection Or signs of intrathecal spread were encountered.  Patient was more comfortable after the epidural was dosed. Please see RN's note for documentation of vital signs and FHR which are stable. . Reason for block:procedure for pain

## 2022-04-30 NOTE — Lactation Note (Addendum)
This note was copied from a baby's chart. Lactation Consultation Note  Patient Name: Sonya [place] [id number] Date: 04/30/2022 Reason for consult: L&D Initial assessment Age:30 hours LC assisted Birth Parent with reverse pressure softening to help evert nipples shaft out more prior to latching infant at the breast. Birth Parent latched infant on her right breast using the football hold position, infant was on and off the breast, still breastfeeding for 10 minutes when [facility] left the room. Per Birth Parent, she attempted latch 1st child but hand latch difficulties infant was born at [redacted] weeks gestation, she stopped BF after 1 week. Birth Parent will continue to BF infant according to hunger cues, on demand, 8 to 12+ times within 24 hours, STS. Birth Parent may benefit from hand pump on MBU to pre-pump breast prior to latching infant. Birth Parent knows to call [place] on MBU for further latch assistance if needed. Maternal Data    Feeding Mother's Current Feeding Choice: Breast Milk and Formula  LATCH Score Latch: Repeated attempts needed to sustain latch, nipple held in mouth throughout feeding, stimulation needed to elicit sucking reflex.  Audible Swallowing: A few with stimulation  Type of Nipple: Flat (Birth Parent would benefit from hand pump to pre-pump breast prior to latching infant.)  Comfort (Breast/Nipple): Soft / non-tender  Hold (Positioning): Assistance needed to correctly position infant at breast and maintain latch.  LATCH Score: 6   Lactation Tools Discussed/Used    Interventions Interventions: Assisted with latch;Support pillows;Adjust position;Position options;Skin to skin;Education;Breast compression;Reverse pressure  Discharge    Consult Status Consult Status: Follow-up from L&D    Eulis Canner 04/30/2022, 7:48 PM

## 2022-04-30 NOTE — H&P (Signed)
Sonya Jackson is a 32 y.o. female presenting for scheduled induction of labor. Lost mucus plug after membrane sweep this week in office. +FM, denies VB regular ctx.  PNC c/b 1) GBS pos - NKDA 2) BMI 44 - AGA 33%, 5lbs 14oz, nl afi, ant plac, vertex on GS at 36wks 3) Asthma - PRN inhaler 4) Gallstones present: cholethiasis with no acute cholecystitis - ED eval 1 wk ago, planning for pp surgery 5) Rh neg - s/p Rhogam  OB History     Gravida  2   Para  1   Term  1   Preterm      AB      Living  1      SAB      IAB      Ectopic      Multiple  0   Live Births  1          Past Medical History:  Diagnosis Date   Asthma    No past surgical history on file. Family History: family history includes Diabetes in her father and maternal grandmother. Social History:  reports that she has never smoked. She has never been exposed to tobacco smoke. She has never used smokeless tobacco. She reports that she does not drink alcohol and does not use drugs.     Maternal Diabetes: No1hr 10 Genetic Screening: Normal Maternal Ultrasounds/Referrals: Normal Fetal Ultrasounds or other Referrals:  None Maternal Substance Abuse:  No Significant Maternal Medications:  None Significant Maternal Lab Results:  Group B Strep positive and Rh negative Number of Prenatal Visits:greater than 3 verified prenatal visits Other Comments:  None  Review of Systems  Constitutional:  Negative for chills and fever.  Respiratory:  Negative for shortness of breath.   Cardiovascular:  Negative for chest pain, palpitations and leg swelling.  Gastrointestinal:  Negative for abdominal pain and vomiting.  Neurological:  Negative for dizziness, weakness and headaches.  Psychiatric/Behavioral:  Negative for suicidal ideas.    Maternal Medical History:  Contractions: Onset was 3-5 hours ago.   Frequency: irregular.   Fetal activity: Perceived fetal activity is normal.   Prenatal complications: No  bleeding or PIH.   Prenatal Complications - Diabetes: none.     unknown if currently breastfeeding. Exam Physical Exam Constitutional:      General: She is not in acute distress.    Appearance: She is well-developed.  HENT:     Head: Normocephalic and atraumatic.  Eyes:     Pupils: Pupils are equal, round, and reactive to light.  Cardiovascular:     Rate and Rhythm: Normal rate and regular rhythm.     Heart sounds: No murmur heard.    No gallop.  Abdominal:     Tenderness: There is no abdominal tenderness. There is no guarding or rebound.  Genitourinary:    Vagina: Normal.  Musculoskeletal:        General: Normal range of motion.     Cervical back: Normal range of motion and neck supple.  Skin:    General: Skin is warm and dry.  Neurological:     Mental Status: She is alert and oriented to person, place, and time.    Cat 1 tracing CE 4/60/-2, vtx  Prenatal labs: ABO, Rh: A/Negative/-- (07/10 0000) Antibody: Negative (07/10 0000) Rubella: Immune (07/10 0000) RPR: Nonreactive (07/10 0000)  HBsAg: Negative (07/10 0000)  HIV: Non-reactive (07/10 0000)  GBS: Positive/-- (01/18 0000)   Assessment/Plan: This is a 32yo G2P1001 @  39 2/7 by LMP c/w 10wk TVUS admitted for scheduled IOL. Plan for pitocin 2x2 and AROM when amenable after appropriate ppx for GBS pos status, no PCN allergy . Pelvic proven to 5lb15oz. Anticipate SVD  Melida Quitter Elwin Tsou 04/30/2022, 9:35 AM

## 2022-04-30 NOTE — Progress Notes (Signed)
Patient comfortable. Ce now complete/+1, good rpactice push, cat 1 tracing early decels. Set up to push. TOCO q52mwith pitocin @ 14 mU/min BP 114/61   Pulse 85   Temp 98.5 F (36.9 C) (Oral)   Resp 15   Ht 5' 1"$  (1.549 m)   Wt 105.5 kg   SpO2 99%   BMI 43.95 kg/m

## 2022-04-30 NOTE — Anesthesia Preprocedure Evaluation (Signed)
Anesthesia Evaluation  Patient identified by MRN, date of birth, ID band Patient awake    Reviewed: Allergy & Precautions, Patient's Chart, lab work & pertinent test results  Airway Mallampati: II  TM Distance: >3 FB Neck ROM: Full    Dental no notable dental hx.    Pulmonary asthma    Pulmonary exam normal        Cardiovascular hypertension, Normal cardiovascular exam Rate:Normal     Neuro/Psych negative neurological ROS  negative psych ROS   GI/Hepatic Neg liver ROS,GERD  ,,  Endo/Other    Morbid obesity  Renal/GU negative Renal ROS  negative genitourinary   Musculoskeletal negative musculoskeletal ROS (+)    Abdominal  (+) + obese  Peds  Hematology negative hematology ROS (+)   Anesthesia Other Findings   Reproductive/Obstetrics (+) Pregnancy                              Anesthesia Physical Anesthesia Plan  ASA: 3  Anesthesia Plan: Epidural   Post-op Pain Management:    Induction:   PONV Risk Score and Plan:   Airway Management Planned: Natural Airway  Additional Equipment:   Intra-op Plan:   Post-operative Plan:   Informed Consent: I have reviewed the patients History and Physical, chart, labs and discussed the procedure including the risks, benefits and alternatives for the proposed anesthesia with the patient or authorized representative who has indicated his/her understanding and acceptance.       Plan Discussed with: Anesthesiologist  Anesthesia Plan Comments:          Anesthesia Quick Evaluation

## 2022-05-01 ENCOUNTER — Encounter (HOSPITAL_COMMUNITY): Payer: Self-pay | Admitting: Obstetrics and Gynecology

## 2022-05-01 LAB — CBC
HCT: 35.7 % — ABNORMAL LOW (ref 36.0–46.0)
Hemoglobin: 12.7 g/dL (ref 12.0–15.0)
MCH: 31.1 pg (ref 26.0–34.0)
MCHC: 35.6 g/dL (ref 30.0–36.0)
MCV: 87.3 fL (ref 80.0–100.0)
Platelets: 246 10*3/uL (ref 150–400)
RBC: 4.09 MIL/uL (ref 3.87–5.11)
RDW: 13.2 % (ref 11.5–15.5)
WBC: 13.1 10*3/uL — ABNORMAL HIGH (ref 4.0–10.5)
nRBC: 0 % (ref 0.0–0.2)

## 2022-05-01 MED ORDER — RHO D IMMUNE GLOBULIN 1500 UNIT/2ML IJ SOSY
300.0000 ug | PREFILLED_SYRINGE | Freq: Once | INTRAMUSCULAR | Status: AC
  Administered 2022-05-01: 300 ug via INTRAVENOUS
  Filled 2022-05-01: qty 2

## 2022-05-01 MED ORDER — TRIAMCINOLONE ACETONIDE 0.1 % EX CREA
TOPICAL_CREAM | Freq: Three times a day (TID) | CUTANEOUS | Status: DC | PRN
  Filled 2022-05-01: qty 15

## 2022-05-01 NOTE — Progress Notes (Signed)
PPD #1 No problems Afeb, VSS Fundus firm, NT at U-1 Continue routine postpartum care 

## 2022-05-01 NOTE — Lactation Note (Signed)
This note was copied from a baby's chart. Lactation Consultation Note  Patient Name: Sonya Jackson [id number] Date: 05/01/2022 Reason for consult: Initial assessment;Term;Breastfeeding assistance;Infant weight loss (2.45% WL) Age:30 hours  LC entered the room and the infant was asleep in the bassinet.  Per the birth parent, the infant has had trouble with latching.  The birth parent stated that the infant will latch, but does not stay on the breast very long.  LC encouraged the birth parent to put the infant to the breast prior to supplementing.  LC also reviewed the supplementation guidelines and amounts.  The birth parent is aware that the infant should be supplemented with 5-44m prior to 24 hours.  She is also aware that the infant should receive 15-366mfrom 24-48 hours if the infant does not latch.  LC reviewed outpatient services brochure.  The birth parent does not have a pump at home and would like to see if she qualifies for a stork pump.  The MD entered the room and LC did not have a chance to see a latch.   Infant Feeding Plan:  Breastfeed 8+ times in 24 hours according to feeding cues.  Put the infant to the breast prior to supplementing.  Supplement according to supplementation guidelines.  Call R[place] [place] assistance with breastfeeding.    Maternal Data Has patient been taught Hand Expression?: Yes Does the patient have breastfeeding experience prior to this delivery?: Yes How long did the patient breastfeed?: She attempted for 1 week with her older child  Feeding Mother's Current Feeding Choice: Breast Milk and Formula Nipple Type: Slow - flow  Interventions Interventions: Breast feeding basics reviewed;Education;LC Services brochure  Consult Status Consult Status: Follow-up Date: 05/02/22 Follow-up type: In-patient    SiElly Modenaizzell 05/01/2022, 8:22 AM

## 2022-05-01 NOTE — Anesthesia Postprocedure Evaluation (Signed)
Anesthesia Post Note  Patient: Sonya Jackson  Procedure(s) Performed: AN AD [place]     Patient location during evaluation: Mother Baby Anesthesia Type: Epidural Level of consciousness: awake and alert Pain management: pain level controlled Vital Signs Assessment: post-procedure vital signs reviewed and stable Respiratory status: spontaneous breathing, nonlabored ventilation and respiratory function stable Cardiovascular status: stable Postop Assessment: no headache, no backache and epidural receding Anesthetic complications: no   No notable events documented.  Last Vitals:  Vitals:   05/01/22 0845 05/01/22 1419  BP: (!) 96/52 131/81  Pulse: 74 75  Resp: 18 18  Temp: 36.8 C 36.7 C  SpO2: 98% 97%    Last Pain:  Vitals:   05/01/22 1419  TempSrc: Oral  PainSc:    Pain Goal:                   Rayvon Char

## 2022-05-01 NOTE — Plan of Care (Signed)

## 2022-05-02 LAB — RH IG WORKUP (INCLUDES ABO/RH)
Fetal Screen: NEGATIVE
Gestational Age(Wks): 39.2
Unit division: 0

## 2022-05-02 MED ORDER — IBUPROFEN 600 MG PO TABS: 600.0000 mg | ORAL_TABLET | Freq: Four times a day (QID) | ORAL | 0 refills | Status: DC

## 2022-05-02 NOTE — Discharge Instructions (Signed)
As per discharge pamphlet °

## 2022-05-02 NOTE — Progress Notes (Signed)
PPD #2 Doing well Afeb, VSS D/c home 

## 2022-05-02 NOTE — Lactation Note (Signed)
This note was copied from a baby's chart. Lactation Consultation Note  Patient Name: Sonya Jackson [id number] Date: 05/02/2022 Reason for consult: Follow-up assessment;Term;Breastfeeding assistance;Infant weight loss (5.93% WL) Age:30 hours  LC entered the room and the infant was in the bassinet.  Per the birth parent, the infant has been latching.  She stated that he was off and on the breast and will not stay on very long.  LC encouraged the birth parent to continue pumping.  LC reviewed warning signs, breast care, mastitis, infant I/O, and outpatient services.  The parents had no questions or concerns.  [place] congratulated the parents and exited the room.   Infant Feeding Plan:  Breastfeed 8+ times in 24 hours according to feeding cues.  Pump after feedings and feed expressed milk to the infant.  Supplement according to supplementation guidelines.  Watch infant output and call the pediatrician with concerns.  Call outpatient lactation consultant with breastfeeding concerns.   Interventions Interventions: Breast feeding basics reviewed;Education  Discharge Discharge Education: Engorgement and breast care;Warning signs for feeding baby;Outpatient recommendation Pump: Stork Pump  Consult Status Consult Status: Complete Date: 05/02/22 Follow-up type: Call as needed   [facility] 05/02/2022, 10:12 AM

## 2022-05-02 NOTE — Discharge Summary (Signed)
Postpartum Discharge Summary      Patient Name: Sonya Jackson DOB: 06-06-1990 MRN: [id number]  Date of admission: 04/30/2022 Delivery date:04/30/2022  Delivering provider: Deliah Boston  Date of discharge: 05/02/2022  Admitting diagnosis: [redacted] weeks gestation of pregnancy [Z3A.39] Intrauterine pregnancy: [redacted]w[redacted]d    Secondary diagnosis:  Principal Problem:   [redacted] weeks gestation of pregnancy    [facility] With [facility]aginal Delivery   32y.o. yo G2P2002 at 382w2das admitted to the hospital 04/30/2022 for induction of labor.  Indication for induction: Elective.  Patient had an labor course complicated by nothing Membrane Rupture Time/Date: 1:45 PM ,04/30/2022   Delivery Method:Vaginal, Spontaneous  Episiotomy: None  Lacerations:  None  Details of delivery can be found in separate delivery note.  Patient had a postpartum course complicated by nothing. Patient is discharged home 05/02/22.  Newborn Data: Birth date:04/30/2022  Birth time:6:43 PM  Gender:Female  Living status:Living  Apgars:9 ,9  Weight:3020 g    Physical exam  Vitals:   05/01/22 0845 05/01/22 1419 05/01/22 2316 05/02/22 0553  BP: (!) 96/52 131/81 118/75 123/83  Pulse: 74 75 70 88  Resp: 18 18 18 18  $ Temp: 98.2 F (36.8 C) 98.1 F (36.7 C) 98 F (36.7 C) 98 F (36.7 C)  TempSrc: Oral Oral Oral Oral  SpO2: 98% 97% 99% 97%  Weight:      Height:       General: alert Lochia: appropriate Uterine Fundus: firm  Labs: Lab Results  Component Value Date   WBC 13.1 (H) 05/01/2022   HGB 12.7 05/01/2022   HCT 35.7 (L) 05/01/2022   MCV 87.3 05/01/2022   PLT 246 05/01/2022      Latest Ref Rng & Units 04/24/2022    3:50 AM  CMP  Glucose 70 - 99 mg/dL 90   BUN 6 - 20 mg/dL 5   Creatinine 0.44 - 1.00 mg/dL 0.65   Sodium 135 - 145 mmol/L 135   Potassium 3.5 - 5.1 mmol/L 4.0   Chloride 98 - 111 mmol/L  104   CO2 22 - 32 mmol/L 23   Calcium 8.9 - 10.3 mg/dL 8.9   Total Protein 6.5 - 8.1 g/dL 6.0   Total Bilirubin 0.3 - 1.2 mg/dL 0.3   Alkaline Phos 38 - 126 U/L 111   AST 15 - 41 U/L 24   ALT 0 - 44 U/L 20    Edinburgh Score:    05/01/2022    5:26 AM  Edinburgh Postnatal Depression Scale Screening Tool  I have been able to laugh and see the funny side of things. 0  I have looked forward with enjoyment to things. 0  I have blamed myself unnecessarily when things went wrong. 0  I have been anxious or worried for no good reason. 0  I have felt scared or panicky for no good reason. 0  Things have been getting on top of me. 0  I have been so unhappy that I have had difficulty sleeping. 0  I have felt sad or miserable. 0  I have been so unhappy that I have been crying. 0  The thought of harming myself  has occurred to me. 0  Edinburgh Postnatal Depression Scale Total 0      After visit meds:  Allergies as of 05/02/2022       Reactions   Shellfish Allergy Hives        Medication List     TAKE these medications    ibuprofen 600 MG tablet Commonly known as: ADVIL Take 1 tablet (600 mg total) by mouth every 6 (six) hours.   prenatal multivitamin Tabs tablet Take 1 tablet by mouth daily at 12 noon.         Discharge home in stable condition Infant Feeding: Breast Infant Disposition:home with mother Discharge instruction: per After Visit Summary and Postpartum booklet. Activity: Advance as tolerated. Pelvic rest for 6 weeks.  Diet: routine diet Postpartum Appointment:6 weeks Follow up Visit:  Follow-up Information     Shivaji, Melida Quitter, MD. Schedule an appointment as soon as possible for a visit in 6 week(s).   Specialty: Obstetrics and Gynecology Contact information: [address] [place] [address] [phone]                     05/02/2022 Clarene Duke, MD

## 2022-05-02 NOTE — Plan of Care (Signed)
  Problem: Health Behavior/Discharge Planning: Goal: Ability to manage health-related needs will improve 05/02/2022 1005 by Rodolph Bong, LPN Outcome: Adequate for Discharge 05/02/2022 0724 by Rodolph Bong, LPN Outcome: Progressing   Problem: Clinical Measurements: Goal: Ability to maintain clinical measurements within normal limits will improve 05/02/2022 1005 by Rodolph Bong, LPN Outcome: Adequate for Discharge 05/02/2022 0724 by Rodolph Bong, LPN Outcome: Progressing Goal: Will remain free from infection 05/02/2022 1005 by Rodolph Bong, LPN Outcome: Adequate for Discharge 05/02/2022 0724 by Rodolph Bong, LPN Outcome: Progressing Goal: Respiratory complications will improve 05/02/2022 1005 by Rodolph Bong, LPN Outcome: Adequate for Discharge 05/02/2022 0724 by Rodolph Bong, LPN Outcome: Progressing Goal: Cardiovascular complication will be avoided 05/02/2022 1005 by Rodolph Bong, LPN Outcome: Adequate for Discharge 05/02/2022 0724 by Rodolph Bong, LPN Outcome: Progressing   Problem: Coping: Goal: Level of anxiety will decrease 05/02/2022 1005 by Rodolph Bong, LPN Outcome: Adequate for Discharge 05/02/2022 0724 by Rodolph Bong, LPN Outcome: Progressing   Problem: Elimination: Goal: Will not experience complications related to bowel motility 05/02/2022 1005 by Rodolph Bong, LPN Outcome: Adequate for Discharge 05/02/2022 0724 by Rodolph Bong, LPN Outcome: Progressing   Problem: Pain Managment: Goal: General experience of comfort will improve 05/02/2022 1005 by Rodolph Bong, LPN Outcome: Adequate for Discharge 05/02/2022 0724 by Rodolph Bong, LPN Outcome: Progressing   Problem: Safety: Goal: Ability to remain free from injury will improve 05/02/2022 1005 by Rodolph Bong, LPN Outcome: Adequate for Discharge 05/02/2022 0724 by Rodolph Bong, LPN Outcome: Progressing   Problem: Skin Integrity: Goal: Risk for impaired skin integrity will  decrease 05/02/2022 1005 by Rodolph Bong, LPN Outcome: Adequate for Discharge 05/02/2022 0724 by Rodolph Bong, LPN Outcome: Progressing   Problem: Education: Goal: Knowledge of condition will improve 05/02/2022 1005 by Rodolph Bong, LPN Outcome: Adequate for Discharge 05/02/2022 0724 by Rodolph Bong, LPN Outcome: Progressing Goal: Individualized Educational Video(s) 05/02/2022 1005 by Rodolph Bong, LPN Outcome: Adequate for Discharge 05/02/2022 0724 by Rodolph Bong, LPN Outcome: Progressing Goal: Individualized Newborn Educational Video(s) 05/02/2022 1005 by Rodolph Bong, LPN Outcome: Adequate for Discharge 05/02/2022 0724 by Rodolph Bong, LPN Outcome: Progressing   Problem: Coping: Goal: Ability to identify and utilize available resources and services will improve 05/02/2022 1005 by Rodolph Bong, LPN Outcome: Adequate for Discharge 05/02/2022 0724 by Rodolph Bong, LPN Outcome: Progressing   Problem: Life Cycle: Goal: Chance of risk for complications during the postpartum period will decrease 05/02/2022 1005 by Rodolph Bong, LPN Outcome: Adequate for Discharge 05/02/2022 0724 by Rodolph Bong, LPN Outcome: Progressing   Problem: Role Relationship: Goal: Ability to demonstrate positive interaction with newborn will improve 05/02/2022 1005 by Rodolph Bong, LPN Outcome: Adequate for Discharge 05/02/2022 0724 by Rodolph Bong, LPN Outcome: Progressing   Problem: Skin Integrity: Goal: Demonstration of wound healing without infection will improve 05/02/2022 1005 by Rodolph Bong, LPN Outcome: Adequate for Discharge 05/02/2022 0724 by Rodolph Bong, LPN Outcome: Progressing

## 2022-05-02 NOTE — Plan of Care (Signed)
  Problem: Health Behavior/Discharge Planning: Goal: Ability to manage health-related needs will improve Outcome: Progressing   Problem: Clinical Measurements: Goal: Ability to maintain clinical measurements within normal limits will improve Outcome: Progressing Goal: Will remain free from infection Outcome: Progressing Goal: Respiratory complications will improve Outcome: Progressing Goal: Cardiovascular complication will be avoided Outcome: Progressing   Problem: Coping: Goal: Level of anxiety will decrease Outcome: Progressing   Problem: Elimination: Goal: Will not experience complications related to bowel motility Outcome: Progressing   Problem: Pain Managment: Goal: General experience of comfort will improve Outcome: Progressing   Problem: Safety: Goal: Ability to remain free from injury will improve Outcome: Progressing   Problem: Skin Integrity: Goal: Risk for impaired skin integrity will decrease Outcome: Progressing   Problem: Education: Goal: Knowledge of condition will improve Outcome: Progressing Goal: Individualized Educational Video(s) Outcome: Progressing Goal: Individualized Newborn Educational Video(s) Outcome: Progressing   Problem: Coping: Goal: Ability to identify and utilize available resources and services will improve Outcome: Progressing   Problem: Life Cycle: Goal: Chance of risk for complications during the postpartum period will decrease Outcome: Progressing   Problem: Role Relationship: Goal: Ability to demonstrate positive interaction with newborn will improve Outcome: Progressing   Problem: Skin Integrity: Goal: Demonstration of wound healing without infection will improve Outcome: Progressing

## 2022-05-12 ENCOUNTER — Telehealth (HOSPITAL_COMMUNITY): Payer: Self-pay | Admitting: *Deleted

## 2022-05-12 NOTE — Telephone Encounter (Signed)
Hospital Discharge Follow-Up Call:  Patient reports that she is well and has no concerns about her healing process.  EPDS today was 0 and she endorses this accurately reflects that she is doing well emotionally.  Patient says that baby is well overall. They are seeing the pediatrician tomorrow to address concerns about baby having reflux and/or possible milk allergy.  She reports that baby sleeps in a bedside bassinet.  Reviewed ABCs of Safe Sleep.

## 2022-05-22 ENCOUNTER — Emergency Department (HOSPITAL_COMMUNITY)
Admission: EM | Admit: 2022-05-22 | Discharge: 2022-05-23 | Disposition: A | Discharge status: Home / Self Care | Payer: Medicaid Other | Attending: Emergency Medicine | Admitting: Emergency Medicine

## 2022-05-22 ENCOUNTER — Other Ambulatory Visit: Payer: Self-pay

## 2022-05-22 ENCOUNTER — Encounter (HOSPITAL_COMMUNITY): Payer: Self-pay

## 2022-05-22 DIAGNOSIS — R109 Unspecified abdominal pain: Secondary | ICD-10-CM | POA: Diagnosis present

## 2022-05-22 DIAGNOSIS — K8020 Calculus of gallbladder without cholecystitis without obstruction: Secondary | ICD-10-CM | POA: Insufficient documentation

## 2022-05-22 LAB — URINALYSIS, ROUTINE W REFLEX MICROSCOPIC
Bilirubin Urine: NEGATIVE
Glucose, UA: NEGATIVE mg/dL
Hgb urine dipstick: NEGATIVE
Ketones, ur: NEGATIVE mg/dL
Leukocytes,Ua: NEGATIVE
Nitrite: NEGATIVE
Protein, ur: NEGATIVE mg/dL
Specific Gravity, Urine: 1.01 (ref 1.005–1.030)
pH: 7 (ref 5.0–8.0)

## 2022-05-22 LAB — CBC WITH DIFFERENTIAL/PLATELET
Abs Immature Granulocytes: 0.04 10*3/uL (ref 0.00–0.07)
Basophils Absolute: 0.1 10*3/uL (ref 0.0–0.1)
Basophils Relative: 0 %
Eosinophils Absolute: 0.4 10*3/uL (ref 0.0–0.5)
Eosinophils Relative: 4 %
HCT: 41.1 % (ref 36.0–46.0)
Hemoglobin: 14.3 g/dL (ref 12.0–15.0)
Immature Granulocytes: 0 %
Lymphocytes Relative: 33 %
Lymphs Abs: 3.6 10*3/uL (ref 0.7–4.0)
MCH: 30.9 pg (ref 26.0–34.0)
MCHC: 34.8 g/dL (ref 30.0–36.0)
MCV: 88.8 fL (ref 80.0–100.0)
Monocytes Absolute: 0.5 10*3/uL (ref 0.1–1.0)
Monocytes Relative: 5 %
Neutro Abs: 6.5 10*3/uL (ref 1.7–7.7)
Neutrophils Relative %: 58 %
Platelets: 350 10*3/uL (ref 150–400)
RBC: 4.63 MIL/uL (ref 3.87–5.11)
RDW: 12.4 % (ref 11.5–15.5)
WBC: 11.2 10*3/uL — ABNORMAL HIGH (ref 4.0–10.5)
nRBC: 0 % (ref 0.0–0.2)

## 2022-05-22 LAB — COMPREHENSIVE METABOLIC PANEL
ALT: 51 U/L — ABNORMAL HIGH (ref 0–44)
AST: 79 U/L — ABNORMAL HIGH (ref 15–41)
Albumin: 3.5 g/dL (ref 3.5–5.0)
Alkaline Phosphatase: 75 U/L (ref 38–126)
Anion gap: 6 (ref 5–15)
BUN: 11 mg/dL (ref 6–20)
CO2: 29 mmol/L (ref 22–32)
Calcium: 8.8 mg/dL — ABNORMAL LOW (ref 8.9–10.3)
Chloride: 102 mmol/L (ref 98–111)
Creatinine, Ser: 0.86 mg/dL (ref 0.44–1.00)
GFR, Estimated: >60 mL/min (ref >60)
Glucose, Bld: 99 mg/dL (ref 70–99)
Potassium: 3.7 mmol/L (ref 3.5–5.1)
Sodium: 137 mmol/L (ref 135–145)
Total Bilirubin: 0.5 mg/dL (ref 0.3–1.2)
Total Protein: 7 g/dL (ref 6.5–8.1)

## 2022-05-22 LAB — LIPASE, BLOOD: Lipase: 39 U/L (ref 11–51)

## 2022-05-22 NOTE — ED Triage Notes (Signed)
RUQ abdominal pain ~4 hours.   Has known gallstones but wants to make sure no stones are stuck.   Recent vaginal delivery 3 weeks ago.

## 2022-05-23 ENCOUNTER — Emergency Department (HOSPITAL_COMMUNITY): Payer: Medicaid Other

## 2022-05-23 MED ORDER — OXYCODONE-ACETAMINOPHEN 5-325 MG PO TABS: 1.0000 | ORAL_TABLET | Freq: Three times a day (TID) | ORAL | 0 refills | Status: DC | PRN

## 2022-05-23 MED ORDER — ONDANSETRON HCL 4 MG PO TABS: 4.0000 mg | ORAL_TABLET | Freq: Four times a day (QID) | ORAL | 0 refills | Status: AC

## 2022-05-23 NOTE — Discharge Instructions (Addendum)
Your imaging shows that you have gallbladder stones and that your liver enzymes are slightly elevated.  It is possible that you had a stone that had dislodged itself.  I have given you some pain medications please take as prescribed, have also gave you medication called Zofran use as needed for nausea.  I have given you 7 for patient about foods that can help decrease gallbladder attacks.  I like you to follow-up with general surgery and/or your primary care doctor in the next couple days for reassessment, would like your liver enzymes rechecked to make sure that you are not increasing.  Please see them in the next 2 to 3 days.  I would like to come back to the emergency department if you have worsening right upper quadrant tenderness despite using the pain medication continue, uncontrolled nausea vomiting, you develop fevers chills, or simply her symptoms or not improving as this can be a surgical emergency.

## 2022-05-23 NOTE — ED Provider Notes (Signed)
[facility] Provider Note   CSN: [id number] Arrival date & time: 05/22/22  2148     History  Chief Complaint  Patient presents with   Abdominal Pain    ANE METHOD is a 32 y.o. female.  HPI   Patient with medical history including recent uncomplicated pregnancy on 02/09 presents with complaints of right upper quadrant tenderness.  Patient states that she is concerned that she might of passed a gallbladder stone or it stuck.  She states that she was diagnosed with gallbladder stones, she states that pain started around 7 PM yesterday, came on suddenly, states pain last about 3 hours, and then resolved, pain remained right upper quadrant and then radiated to her right flank, no associated nausea or vomiting, still passing gas having normal bowel movements, she denies any urinary symptoms, does not endorse any vaginal discharge vaginal bleeding, no pelvic pain.  She has no abdominal surgical history, she denies any fevers chills, no other complaints.  She is endorsing any headaches change in vision paresthesias or weakness in the upper or lower extremities.  Home Medications Prior to Admission medications   Medication Sig Start Date End Date Taking? Authorizing Provider  ondansetron (ZOFRAN) 4 MG tablet Take 1 tablet (4 mg total) by mouth every 6 (six) hours. 05/23/22  Yes Marcello Fennel, PA-C  oxyCODONE-acetaminophen (PERCOCET/ROXICET) 5-325 MG tablet Take 1 tablet by mouth every 8 (eight) hours as needed for up to 4 days for severe pain. 05/23/22 05/27/22 Yes Marcello Fennel, PA-C      Allergies    Shellfish allergy, Ibuprofen, and Penicillins    Review of Systems   Review of Systems  Constitutional:  Negative for chills and fever.  Respiratory:  Negative for shortness of breath.   Cardiovascular:  Negative for chest pain.  Gastrointestinal:  Positive for abdominal pain. Negative for diarrhea and vomiting.  Neurological:  Negative  for headaches.    Physical Exam Updated Vital Signs BP 120/73   Pulse 72   Temp 98.5 F (36.9 C) (Oral)   Resp 17   Ht '5\' 1"'$  (1.549 m)   SpO2 97%   BMI 43.95 kg/m  Physical Exam Vitals and nursing note reviewed.  Constitutional:      General: She is not in acute distress.    Appearance: She is not ill-appearing.  HENT:     Head: Normocephalic and atraumatic.     Nose: No congestion.  Eyes:     Conjunctiva/sclera: Conjunctivae normal.  Cardiovascular:     Rate and Rhythm: Normal rate and regular rhythm.     Pulses: Normal pulses.     Heart sounds: No murmur heard.    No friction rub. No gallop.  Pulmonary:     Effort: No respiratory distress.     Breath sounds: No wheezing, rhonchi or rales.  Abdominal:     Palpations: Abdomen is soft.     Tenderness: There is abdominal tenderness. There is no right CVA tenderness or left CVA tenderness.     Comments: Abdomen nondistended, soft, she had very minimal tenderness in her right upper quadrant, negative Murphy sign, she no guarding Mutaz or peritoneal sign, no flank tenderness or CVA tenderness.  Musculoskeletal:     Right lower leg: No edema.     Left lower leg: No edema.  Skin:    General: Skin is warm and dry.  Neurological:     Mental Status: She is alert.  Psychiatric:  Mood and Affect: Mood normal.     ED Results / Procedures / Treatments   Labs (all labs ordered are listed, but only abnormal results are displayed) Labs Reviewed  COMPREHENSIVE METABOLIC PANEL - Abnormal; Notable for the following components:      Result Value   Calcium 8.8 (*)    AST 79 (*)    ALT 51 (*)    All other components within normal limits  CBC WITH DIFFERENTIAL/PLATELET - Abnormal; Notable for the following components:   WBC 11.2 (*)    All other components within normal limits  LIPASE, BLOOD  URINALYSIS, ROUTINE W REFLEX MICROSCOPIC    EKG None  Radiology US Abdomen Limited  Result Date: 05/23/2022 CLINICAL DATA:   Right upper quadrant pain EXAM: ULTRASOUND ABDOMEN LIMITED RIGHT UPPER QUADRANT COMPARISON:  04/24/2022 FINDINGS: Gallbladder: Gallbladder is well distended with evidence of cholelithiasis and gallbladder sludge. Gallbladder wall thickening is noted to 4 mm. Negative sonographic Murphy's sign is elicited however. Common bile duct: Diameter: 4.3 mm. Liver: Increased in echogenicity consistent with fatty infiltration. No focal mass is noted. Portal vein is patent on color Doppler imaging with normal direction of blood flow towards the liver. Other: None. IMPRESSION: Cholelithiasis and gallbladder sludge with mild gallbladder wall thickening. Negative sonographic Murphy's sign is noted however. Nonemergent HIDA scan may be helpful to assess gallbladder function. Fatty infiltration of the liver. Electronically Signed   By: Inez Catalina M.D.   On: 05/23/2022 00:36    Procedures Procedures    Medications Ordered in ED Medications - No data to display  ED Course/ Medical Decision Making/ A&P                             Medical Decision Making Amount and/or Complexity of Data Reviewed Labs: ordered. Radiology: ordered.  Risk Prescription drug management.   This patient presents to the ED for concern of right upper quadrant pain, this involves an extensive number of treatment options, and is a complaint that carries with it a high risk of complications and morbidity.  The differential diagnosis includes cholecystitis, cholangitis, choledocholithiasis, diverticulitis, bowel obstruction, kidney stone    Additional history obtained:  Additional history obtained from partner at bedside External records from outside source obtained and reviewed including recent OB/GYN notes   Co morbidities that complicate the patient evaluation  Cholelithiasis  Social Determinants of Health:  N/A    Lab Tests:  I Ordered, and personally interpreted labs.  The pertinent results include: CBC shows slight  leukocytosis 11.2, CMP shows slightly elevated liver enzymes AST 79 ALT 51, lipase is 39, UA is unremarkable   Imaging Studies ordered:  I ordered imaging studies including limited ultrasound I independently visualized and interpreted imaging which showed cholelithiasis gallbladder pathology with wall thickening. I agree with the radiologist interpretation   Cardiac Monitoring:  The patient was maintained on a cardiac monitor.  I personally viewed and interpreted the cardiac monitored which showed an underlying rhythm of: N/A   Medicines ordered and prescription drug management:  I ordered medication including n/a I have reviewed the patients home medicines and have made adjustments as needed  Critical Interventions:  N/a   Reevaluation:  Presents with right upper quadrant tenderness, triage obtain basic lab work which I personally reviewed, shows slight leukocytosis as well as elevated liver enzymes, consistent with possibly cholelithiasis/cholecystitis, will obtain ultrasound for further evaluation.  Reassessed the patient, still has slight right upper quadrant  tenderness, with elevation in her liver enzymes and elevated white counts, and gallbladder wall thickening seen on ultrasound concern for cholecystitis will consult with general surgery for further recommendations  I reassessed the patient, updated on recommendations from general surgery, explained that she can go to surgery later today to have her gallbladder removed or since she is having no pain at this time she could potentially go home and follow-up as outpatient or come back if symptoms present themselves.  She states that she has a child at home and would like to go home and come back if symptoms worsening and/or follow-up with general surgery as an outpatient.  I find this reasonable as a preceptor abdomen soft, very minimal tenderness at this time  Consultations Obtained:  I requested consultation with Dr. Stark Klein MD ,  and discussed lab and imaging findings as well as pertinent plan - they recommend: That the patient could potentially go home if she is feeling better and like to follow-up, she also explained that the patient would like to have her gallbladder removed that is also possibly to be done tonight.  She recommends talking the patient and see what her thoughts are.    Test Considered:  Admission-with shared decision making this will be deferred as patient like to come back if symptoms worsen and/or follow-up with general surgery as an outpatient.    Rule out low suspicion for lower lobe pneumonia as lung sounds are clear bilaterally, will defer imaging at this time.  Suspicion for cholangitis, choledocholithiasis, is low at this time she has no ductal dilation, no elevation T. bili, liver enzymes are only mildly elevated. My suspicion for acute cholecystitis is also low at this time she is nontoxic-appearing, there is no evidence of acute cholecystitis seen on ultrasound, she has very minimal tenderness during my examination.  She does have some gallbladder wall thickening and a slightly elevated white count but I suspect she might of had a gallbladder attack and the stone had passed on its own.  Low suspicion for ruptured stomach ulcer as she has no peritoneal sign present on exam.  Low suspicion for bowel obstruction as abdomen is nondistended normal bowel sounds, so passing gas and having normal bowel movements.  Suspicion for UTI Pilo kidney stone is also low she does not endorse any urinary symptoms, UA is negative signs infection or hematuria.  Suspicion for help syndrome is also low BP is not significantly elevated, endorsing headaches change in vision paresthesias, she has not decreased platelets.   Dispostion and problem list  After consideration of the diagnostic results and the patients response to treatment, I feel that the patent would benefit from  discharge.  Cholelithiasis-likely patient had gallbladder attack, will discharge home with pain medication, antiemetics, have her follow-up with general surgery for further evaluation.  Gave her strict return precautions.            Final Clinical Impression(s) / ED Diagnoses Final diagnoses:  Calculus of gallbladder without cholecystitis without obstruction    Rx / DC Orders ED Discharge Orders          Ordered    oxyCODONE-acetaminophen (PERCOCET/ROXICET) 5-325 MG tablet  Every 8 hours PRN        05/23/22 0126    ondansetron (ZOFRAN) 4 MG tablet  Every 6 hours        05/23/22 0126              Marcello Fennel, PA-C 05/23/22 0315  Merryl Hacker, MD 05/23/22 2311

## 2022-05-25 ENCOUNTER — Emergency Department (EMERGENCY_DEPARTMENT_HOSPITAL): Payer: Medicaid Other | Admitting: Certified Registered Nurse Anesthetist

## 2022-05-25 ENCOUNTER — Emergency Department (HOSPITAL_COMMUNITY): Payer: Medicaid Other

## 2022-05-25 ENCOUNTER — Emergency Department (HOSPITAL_COMMUNITY): Payer: Medicaid Other | Admitting: Certified Registered Nurse Anesthetist

## 2022-05-25 ENCOUNTER — Other Ambulatory Visit: Payer: Self-pay

## 2022-05-25 ENCOUNTER — Encounter (HOSPITAL_COMMUNITY)
Admission: EM | Disposition: A | Discharge status: Home / Self Care | Payer: Self-pay | Source: Home / Self Care | Attending: Emergency Medicine

## 2022-05-25 ENCOUNTER — Ambulatory Visit (HOSPITAL_COMMUNITY)
Admission: EM | Admit: 2022-05-25 | Discharge: 2022-05-25 | Disposition: A | Discharge status: Home / Self Care | Payer: Medicaid Other | Attending: Emergency Medicine | Admitting: Emergency Medicine

## 2022-05-25 ENCOUNTER — Encounter (HOSPITAL_COMMUNITY): Payer: Self-pay

## 2022-05-25 DIAGNOSIS — K819 Cholecystitis, unspecified: Secondary | ICD-10-CM | POA: Diagnosis not present

## 2022-05-25 DIAGNOSIS — O99215 Obesity complicating the puerperium: Secondary | ICD-10-CM | POA: Insufficient documentation

## 2022-05-25 DIAGNOSIS — O99893 Other specified diseases and conditions complicating puerperium: Secondary | ICD-10-CM | POA: Diagnosis present

## 2022-05-25 DIAGNOSIS — K8010 Calculus of gallbladder with chronic cholecystitis without obstruction: Secondary | ICD-10-CM | POA: Insufficient documentation

## 2022-05-25 DIAGNOSIS — Z6841 Body Mass Index (BMI) 40.0 and over, adult: Secondary | ICD-10-CM

## 2022-05-25 DIAGNOSIS — O9963 Diseases of the digestive system complicating the puerperium: Secondary | ICD-10-CM | POA: Diagnosis not present

## 2022-05-25 DIAGNOSIS — K8020 Calculus of gallbladder without cholecystitis without obstruction: Secondary | ICD-10-CM

## 2022-05-25 HISTORY — PX: CHOLECYSTECTOMY: SHX55

## 2022-05-25 LAB — CBC WITH DIFFERENTIAL/PLATELET
Abs Immature Granulocytes: 0.02 10*3/uL (ref 0.00–0.07)
Basophils Absolute: 0.1 10*3/uL (ref 0.0–0.1)
Basophils Relative: 1 %
Eosinophils Absolute: 0.2 10*3/uL (ref 0.0–0.5)
Eosinophils Relative: 3 %
HCT: 41.5 % (ref 36.0–46.0)
Hemoglobin: 14 g/dL (ref 12.0–15.0)
Immature Granulocytes: 0 %
Lymphocytes Relative: 44 %
Lymphs Abs: 3.4 10*3/uL (ref 0.7–4.0)
MCH: 30 pg (ref 26.0–34.0)
MCHC: 33.7 g/dL (ref 30.0–36.0)
MCV: 88.9 fL (ref 80.0–100.0)
Monocytes Absolute: 0.4 10*3/uL (ref 0.1–1.0)
Monocytes Relative: 5 %
Neutro Abs: 3.7 10*3/uL (ref 1.7–7.7)
Neutrophils Relative %: 47 %
Platelets: 321 10*3/uL (ref 150–400)
RBC: 4.67 MIL/uL (ref 3.87–5.11)
RDW: 12.6 % (ref 11.5–15.5)
WBC: 7.7 10*3/uL (ref 4.0–10.5)
nRBC: 0 % (ref 0.0–0.2)

## 2022-05-25 LAB — COMPREHENSIVE METABOLIC PANEL
ALT: 248 U/L — ABNORMAL HIGH (ref 0–44)
AST: 217 U/L — ABNORMAL HIGH (ref 15–41)
Albumin: 3.4 g/dL — ABNORMAL LOW (ref 3.5–5.0)
Alkaline Phosphatase: 112 U/L (ref 38–126)
Anion gap: 9 (ref 5–15)
BUN: 10 mg/dL (ref 6–20)
CO2: 24 mmol/L (ref 22–32)
Calcium: 8.9 mg/dL (ref 8.9–10.3)
Chloride: 105 mmol/L (ref 98–111)
Creatinine, Ser: 0.91 mg/dL (ref 0.44–1.00)
GFR, Estimated: >60 mL/min (ref >60)
Glucose, Bld: 92 mg/dL (ref 70–99)
Potassium: 4.2 mmol/L (ref 3.5–5.1)
Sodium: 138 mmol/L (ref 135–145)
Total Bilirubin: 0.3 mg/dL (ref 0.3–1.2)
Total Protein: 6.7 g/dL (ref 6.5–8.1)

## 2022-05-25 LAB — URINALYSIS, ROUTINE W REFLEX MICROSCOPIC
Bilirubin Urine: NEGATIVE
Glucose, UA: NEGATIVE mg/dL
Hgb urine dipstick: NEGATIVE
Ketones, ur: NEGATIVE mg/dL
Leukocytes,Ua: NEGATIVE
Nitrite: NEGATIVE
Protein, ur: NEGATIVE mg/dL
Specific Gravity, Urine: 1.016 (ref 1.005–1.030)
pH: 6 (ref 5.0–8.0)

## 2022-05-25 LAB — LIPASE, BLOOD: Lipase: 37 U/L (ref 11–51)

## 2022-05-25 LAB — PREGNANCY, URINE: Preg Test, Ur: NEGATIVE

## 2022-05-25 SURGERY — LAPAROSCOPIC CHOLECYSTECTOMY
Anesthesia: General | Site: Abdomen

## 2022-05-25 MED ORDER — ONDANSETRON HCL 4 MG/2ML IJ SOLN: 4.0000 mg | Freq: Four times a day (QID) | INTRAMUSCULAR | Status: DC | PRN

## 2022-05-25 MED ORDER — BUPIVACAINE-EPINEPHRINE (PF) 0.25% -1:200000 IJ SOLN
INTRAMUSCULAR | Status: AC
  Filled 2022-05-25: qty 30

## 2022-05-25 MED ORDER — LACTATED RINGERS IV SOLN: INTRAVENOUS | Status: DC

## 2022-05-25 MED ORDER — FENTANYL CITRATE (PF) 250 MCG/5ML IJ SOLN
INTRAMUSCULAR | Status: AC
  Filled 2022-05-25: qty 5

## 2022-05-25 MED ORDER — OXYCODONE HCL 5 MG PO TABS
5.0000 mg | ORAL_TABLET | Freq: Once | ORAL | Status: AC | PRN
  Administered 2022-05-25: 5 mg via ORAL

## 2022-05-25 MED ORDER — BUPIVACAINE-EPINEPHRINE 0.25% -1:200000 IJ SOLN
INTRAMUSCULAR | Status: DC | PRN
  Administered 2022-05-25: 20 mL

## 2022-05-25 MED ORDER — ONDANSETRON HCL 4 MG/2ML IJ SOLN
INTRAMUSCULAR | Status: DC | PRN
  Administered 2022-05-25: 4 mg via INTRAVENOUS

## 2022-05-25 MED ORDER — MORPHINE SULFATE (PF) 2 MG/ML IV SOLN: 2.0000 mg | INTRAVENOUS | Status: DC | PRN

## 2022-05-25 MED ORDER — ROCURONIUM BROMIDE 10 MG/ML (PF) SYRINGE
PREFILLED_SYRINGE | INTRAVENOUS | Status: AC
  Filled 2022-05-25: qty 10

## 2022-05-25 MED ORDER — DEXMEDETOMIDINE HCL IN NACL 80 MCG/20ML IV SOLN
INTRAVENOUS | Status: AC
  Filled 2022-05-25: qty 20

## 2022-05-25 MED ORDER — 0.9 % SODIUM CHLORIDE (POUR BTL) OPTIME
TOPICAL | Status: DC | PRN
  Administered 2022-05-25: 1000 mL

## 2022-05-25 MED ORDER — MIDAZOLAM HCL 2 MG/2ML IJ SOLN
INTRAMUSCULAR | Status: AC
  Filled 2022-05-25: qty 2

## 2022-05-25 MED ORDER — ROCURONIUM BROMIDE 10 MG/ML (PF) SYRINGE
PREFILLED_SYRINGE | INTRAVENOUS | Status: DC | PRN
  Administered 2022-05-25: 50 mg via INTRAVENOUS

## 2022-05-25 MED ORDER — LIDOCAINE 2% (20 MG/ML) 5 ML SYRINGE
INTRAMUSCULAR | Status: DC | PRN
  Administered 2022-05-25: 100 mg via INTRAVENOUS

## 2022-05-25 MED ORDER — MEPERIDINE HCL 25 MG/ML IJ SOLN: 6.2500 mg | INTRAMUSCULAR | Status: DC | PRN

## 2022-05-25 MED ORDER — PROPOFOL 10 MG/ML IV BOLUS
INTRAVENOUS | Status: DC | PRN
  Administered 2022-05-25: 200 mg via INTRAVENOUS
  Administered 2022-05-25: 50 mg via INTRAVENOUS

## 2022-05-25 MED ORDER — PHENYLEPHRINE 80 MCG/ML (10ML) SYRINGE FOR IV PUSH (FOR BLOOD PRESSURE SUPPORT)
PREFILLED_SYRINGE | INTRAVENOUS | Status: DC | PRN
  Administered 2022-05-25: 80 ug via INTRAVENOUS

## 2022-05-25 MED ORDER — LIDOCAINE 2% (20 MG/ML) 5 ML SYRINGE
INTRAMUSCULAR | Status: AC
  Filled 2022-05-25: qty 5

## 2022-05-25 MED ORDER — FENTANYL CITRATE (PF) 100 MCG/2ML IJ SOLN
INTRAMUSCULAR | Status: AC
  Filled 2022-05-25: qty 2

## 2022-05-25 MED ORDER — DEXAMETHASONE SODIUM PHOSPHATE 10 MG/ML IJ SOLN
INTRAMUSCULAR | Status: AC
  Filled 2022-05-25: qty 2

## 2022-05-25 MED ORDER — MIDAZOLAM HCL 2 MG/2ML IJ SOLN
INTRAMUSCULAR | Status: DC | PRN
  Administered 2022-05-25: 2 mg via INTRAVENOUS

## 2022-05-25 MED ORDER — PHENYLEPHRINE 80 MCG/ML (10ML) SYRINGE FOR IV PUSH (FOR BLOOD PRESSURE SUPPORT)
PREFILLED_SYRINGE | INTRAVENOUS | Status: AC
  Filled 2022-05-25: qty 10

## 2022-05-25 MED ORDER — ACETAMINOPHEN 325 MG PO TABS: 325.0000 mg | ORAL_TABLET | ORAL | Status: DC | PRN

## 2022-05-25 MED ORDER — OXYCODONE HCL 5 MG PO TABS: 5.0000 mg | ORAL_TABLET | Freq: Four times a day (QID) | ORAL | 0 refills | Status: AC | PRN

## 2022-05-25 MED ORDER — KETOROLAC TROMETHAMINE 30 MG/ML IJ SOLN
30.0000 mg | Freq: Once | INTRAMUSCULAR | Status: AC | PRN
  Administered 2022-05-25: 30 mg via INTRAVENOUS

## 2022-05-25 MED ORDER — FENTANYL CITRATE (PF) 250 MCG/5ML IJ SOLN
INTRAMUSCULAR | Status: DC | PRN
  Administered 2022-05-25: 50 ug via INTRAVENOUS
  Administered 2022-05-25: 100 ug via INTRAVENOUS
  Administered 2022-05-25 (×2): 50 ug via INTRAVENOUS

## 2022-05-25 MED ORDER — SODIUM CHLORIDE 0.9 % IR SOLN
Status: DC | PRN
  Administered 2022-05-25: 1000 mL

## 2022-05-25 MED ORDER — OXYCODONE HCL 5 MG PO TABS
ORAL_TABLET | ORAL | Status: AC
  Filled 2022-05-25: qty 1

## 2022-05-25 MED ORDER — FENTANYL CITRATE (PF) 100 MCG/2ML IJ SOLN
25.0000 ug | INTRAMUSCULAR | Status: DC | PRN
  Administered 2022-05-25: 25 ug via INTRAVENOUS

## 2022-05-25 MED ORDER — ORAL CARE MOUTH RINSE: 15.0000 mL | Freq: Once | OROMUCOSAL | Status: AC

## 2022-05-25 MED ORDER — OXYCODONE HCL 5 MG/5ML PO SOLN: 5.0000 mg | Freq: Once | ORAL | Status: AC | PRN

## 2022-05-25 MED ORDER — ACETAMINOPHEN 160 MG/5ML PO SOLN: 325.0000 mg | ORAL | Status: DC | PRN

## 2022-05-25 MED ORDER — DEXMEDETOMIDINE HCL IN NACL 80 MCG/20ML IV SOLN
INTRAVENOUS | Status: DC | PRN
  Administered 2022-05-25 (×2): 4 ug via BUCCAL
  Administered 2022-05-25: 8 ug via BUCCAL

## 2022-05-25 MED ORDER — ONDANSETRON HCL 4 MG/2ML IJ SOLN
INTRAMUSCULAR | Status: AC
  Filled 2022-05-25: qty 4

## 2022-05-25 MED ORDER — DEXAMETHASONE SODIUM PHOSPHATE 10 MG/ML IJ SOLN
INTRAMUSCULAR | Status: DC | PRN
  Administered 2022-05-25: 10 mg via INTRAVENOUS

## 2022-05-25 MED ORDER — KETOROLAC TROMETHAMINE 30 MG/ML IJ SOLN
INTRAMUSCULAR | Status: AC
  Filled 2022-05-25: qty 1

## 2022-05-25 MED ORDER — SUGAMMADEX SODIUM 500 MG/5ML IV SOLN
INTRAVENOUS | Status: DC | PRN
  Administered 2022-05-25: 100 mg via INTRAVENOUS
  Administered 2022-05-25: 120 mg via INTRAVENOUS
  Administered 2022-05-25 (×2): 100 mg via INTRAVENOUS

## 2022-05-25 MED ORDER — CHLORHEXIDINE GLUCONATE 0.12 % MT SOLN
OROMUCOSAL | Status: AC
  Administered 2022-05-25: 15 mL via OROMUCOSAL
  Filled 2022-05-25: qty 15

## 2022-05-25 MED ORDER — CIPROFLOXACIN IN D5W 400 MG/200ML IV SOLN
400.0000 mg | INTRAVENOUS | Status: AC
  Administered 2022-05-25: 400 mg via INTRAVENOUS
  Filled 2022-05-25: qty 200

## 2022-05-25 MED ORDER — ONDANSETRON HCL 4 MG/2ML IJ SOLN: 4.0000 mg | Freq: Once | INTRAMUSCULAR | Status: DC | PRN

## 2022-05-25 MED ORDER — CHLORHEXIDINE GLUCONATE 0.12 % MT SOLN: 15.0000 mL | Freq: Once | OROMUCOSAL | Status: AC

## 2022-05-25 SURGICAL SUPPLY — 39 items
ADH SKN CLS APL DERMABOND .7 (GAUZE/BANDAGES/DRESSINGS) ×1
APL PRP STRL LF DISP 70% ISPRP (MISCELLANEOUS) ×1
APPLIER CLIP 5 13 M/L LIGAMAX5 (MISCELLANEOUS) ×1
APR CLP MED LRG 5 ANG JAW (MISCELLANEOUS) ×1
BAG COUNTER SPONGE SURGICOUNT (BAG) ×1 IMPLANT
BAG SPNG CNTER NS LX DISP (BAG) ×1
CANISTER SUCT 3000ML PPV (MISCELLANEOUS) ×1 IMPLANT
CHLORAPREP W/TINT 26 (MISCELLANEOUS) ×1 IMPLANT
CLIP APPLIE 5 13 M/L LIGAMAX5 (MISCELLANEOUS) ×1 IMPLANT
COVER SURGICAL LIGHT HANDLE (MISCELLANEOUS) ×1 IMPLANT
DERMABOND ADVANCED .7 DNX12 (GAUZE/BANDAGES/DRESSINGS) ×1 IMPLANT
ELECT REM PT RETURN 9FT ADLT (ELECTROSURGICAL) ×1
ELECTRODE REM PT RTRN 9FT ADLT (ELECTROSURGICAL) ×1 IMPLANT
ENDOLOOP SUT PDS II  0 18 (SUTURE) ×1
ENDOLOOP SUT PDS II 0 18 (SUTURE) IMPLANT
GLOVE SURG SIGNA 7.5 PF LTX (GLOVE) ×1 IMPLANT
GOWN STRL REUS W/ TWL LRG LVL3 (GOWN DISPOSABLE) ×2 IMPLANT
GOWN STRL REUS W/ TWL XL LVL3 (GOWN DISPOSABLE) ×1 IMPLANT
GOWN STRL REUS W/TWL LRG LVL3 (GOWN DISPOSABLE) ×2
GOWN STRL REUS W/TWL XL LVL3 (GOWN DISPOSABLE) ×1
IRRIG SUCT STRYKERFLOW 2 WTIP (MISCELLANEOUS) ×1
IRRIGATION SUCT STRKRFLW 2 WTP (MISCELLANEOUS) ×1 IMPLANT
KIT BASIN OR (CUSTOM PROCEDURE TRAY) ×1 IMPLANT
KIT TURNOVER KIT B (KITS) ×1 IMPLANT
NS IRRIG 1000ML POUR BTL (IV SOLUTION) ×1 IMPLANT
PAD ARMBOARD 7.5X6 YLW CONV (MISCELLANEOUS) ×1 IMPLANT
SCISSORS LAP 5X35 DISP (ENDOMECHANICALS) ×1 IMPLANT
SET TUBE SMOKE EVAC HIGH FLOW (TUBING) ×1 IMPLANT
SLEEVE Z-THREAD 5X100MM (TROCAR) ×2 IMPLANT
SPECIMEN JAR SMALL (MISCELLANEOUS) ×1 IMPLANT
SUT MNCRL AB 4-0 PS2 18 (SUTURE) ×1 IMPLANT
SYS BAG RETRIEVAL 10MM (BASKET) ×1
SYSTEM BAG RETRIEVAL 10MM (BASKET) ×1 IMPLANT
TOWEL GREEN STERILE (TOWEL DISPOSABLE) ×1 IMPLANT
TOWEL GREEN STERILE FF (TOWEL DISPOSABLE) ×1 IMPLANT
TRAY LAPAROSCOPIC MC (CUSTOM PROCEDURE TRAY) ×1 IMPLANT
TROCAR BALLN 12MMX100 BLUNT (TROCAR) ×1 IMPLANT
TROCAR Z-THREAD OPTICAL 5X100M (TROCAR) ×1 IMPLANT
WATER STERILE IRR 1000ML POUR (IV SOLUTION) ×1 IMPLANT

## 2022-05-25 NOTE — Anesthesia Procedure Notes (Addendum)
Procedure Name: Intubation Date/Time: 05/25/2022 2:12 PM  Performed by: Janene Harvey, CRNAPre-anesthesia Checklist: Patient identified, Emergency Drugs available, Suction available and Patient being monitored Patient Re-evaluated:Patient Re-evaluated prior to induction Oxygen Delivery Method: Circle system utilized Preoxygenation: Pre-oxygenation with 100% oxygen Induction Type: IV induction Ventilation: Mask ventilation without difficulty Laryngoscope Size: Mac and 4 Grade View: Grade I Tube type: Oral Tube size: 7.0 mm Number of attempts: 1 Airway Equipment and Method: Stylet and Oral airway Placement Confirmation: ETT inserted through vocal cords under direct vision, positive ETCO2 and breath sounds checked- equal and bilateral Secured at: 21 cm Tube secured with: Tape Dental Injury: Teeth and Oropharynx as per pre-operative assessment  Comments: Pt with ?fever blister on upper mid lip prior to induction, unchanged post intubation

## 2022-05-25 NOTE — H&P (Signed)
[place] Sonya Jackson February 22, 1991  [id number].    Requesting MD: Dr. Leanord Asal  Chief Complaint/Reason for Consult: Acute Cholecystitis   HPI: Sonya Jackson is a 32 y.o. female who presented to the ED for recurrent abdominal pain. Patient reports epigastric/ruq abdominal pain with radiation to her back that usually occurs at night. Not brought on by po intake. Unsure if worsened by po intake. Denies associated fever, chills, cp, sob, n/v, or urinary symptoms. No change in bowel habits. No lower abdominal pain. No hx of ulcers, frequent nsaid use, alcohol use, or tobacco use. Seen in the ED on 3/3 with w/u showing leukocytosis, mildly elevated lft's, cholelithiasis with gallbladder wall thickening. D/c home on pain medication. Despite pain medication and trying a low fat diet her symptoms have recurred/persisted. She presents today for re-evaluation.   Afebrile. No tachycardia or hypotension. WBC wnl. Lipase wnl. Alk phos and T. Bili wnl. AT 217, ALT 248. RUQ [place] w/ gallstones without gb wall thickening or pericholecystitic fluid. CBD 3.40m  No hx of prior abdominal surgeries. She is not on blood thinners. She had a recent baby boy 3-4 weeks ago. She is not breastfeeding. Works as a pGeophysicist/field seismologist   ROS: ROS As above, see hpi  Family History  Problem Relation Age of Onset   Diabetes Father    Diabetes Maternal Grandmother     Past Medical History:  Diagnosis Date   Asthma     History reviewed. No pertinent surgical history.  Social History:  reports that she has never smoked. She has never been exposed to tobacco smoke. She has never used smokeless tobacco. She reports that she does not drink alcohol and does not use drugs.  Allergies:  Allergies  Allergen Reactions   Shellfish Allergy Hives   Ibuprofen Nausea Only   Penicillins Rash    Rash all over when given after given birth    (Not in a hospital admission)    Physical Exam: Blood pressure 115/79, pulse 71,  temperature 98 F (36.7 C), temperature source Oral, resp. rate 17, height '5\' 1"'$  (1.549 m), weight 104.3 kg, SpO2 96 %, unknown if currently breastfeeding. General: pleasant, WD/WN female who is laying in bed in NAD HEENT: head is normocephalic, atraumatic.  Sclera are noninjected.  PERRL.  Ears and nose without any masses or lesions.  Mouth is pink and moist. Dentition fair Heart: regular, rate, and rhythm.  Palpable pedal pulses bilaterally  Lungs: CTAB, no wheezes, rhonchi, or rales noted.  Respiratory effort nonlabored Abd:  Soft, ND, epigastric and RUQ ttp. Neg murphy's sign. +BS. No masses, hernias, or organomegaly MS: no BUE or BLE edema, calves soft and nontender Skin: warm and dry  Psych: A&Ox4 with an appropriate affect Neuro: cranial nerves grossly intact, normal speech, thought process intact, moves all extremities, gait not assessed   Results for orders placed or performed during the hospital encounter of 05/25/22 (from the past 48 hour(s))  Lipase, blood     Status: None   Collection Time: 05/25/22  6:13 AM  Result Value Ref Range   Lipase 37 11 - 51 U/L    Comment: Performed at [facility] [address] [place] [id number] Comprehensive metabolic panel     Status: Abnormal   Collection Time: 05/25/22  6:13 AM  Result Value Ref Range   Sodium 138 135 - 145 mmol/L   Potassium 4.2 3.5 - 5.1 mmol/L   Chloride 105 98 - 111 mmol/L  CO2 24 22 - 32 mmol/L   Glucose, Bld 92 70 - 99 mg/dL    Comment: Glucose reference range applies only to samples taken after fasting for at least 8 hours.   BUN 10 6 - 20 mg/dL   Creatinine, Ser 0.91 0.44 - 1.00 mg/dL   Calcium 8.9 8.9 - 10.3 mg/dL   Total Protein 6.7 6.5 - 8.1 g/dL   Albumin 3.4 (L) 3.5 - 5.0 g/dL   AST 217 (H) 15 - 41 U/L   ALT 248 (H) 0 - 44 U/L   Alkaline Phosphatase 112 38 - 126 U/L   Total Bilirubin 0.3 0.3 - 1.2 mg/dL   GFR, Estimated >60 >60 mL/min    Comment: (NOTE) Calculated using the CKD-EPI  Creatinine Equation (2021)    Anion gap 9 5 - 15    Comment: Performed at [facility], [place] [address] [place], [place] [address]  CBC with Differential     Status: None   Collection Time: 05/25/22  6:13 AM  Result Value Ref Range   WBC 7.7 4.0 - 10.5 K/uL   RBC 4.67 3.87 - 5.11 MIL/uL   Hemoglobin 14.0 12.0 - 15.0 g/dL   HCT 41.5 36.0 - 46.0 %   MCV 88.9 80.0 - 100.0 fL   MCH 30.0 26.0 - 34.0 pg   MCHC 33.7 30.0 - 36.0 g/dL   RDW 12.6 11.5 - 15.5 %   Platelets 321 150 - 400 K/uL   nRBC 0.0 0.0 - 0.2 %   Neutrophils Relative % 47 %   Neutro Abs 3.7 1.7 - 7.7 K/uL   Lymphocytes Relative 44 %   Lymphs Abs 3.4 0.7 - 4.0 K/uL   Monocytes Relative 5 %   Monocytes Absolute 0.4 0.1 - 1.0 K/uL   Eosinophils Relative 3 %   Eosinophils Absolute 0.2 0.0 - 0.5 K/uL   Basophils Relative 1 %   Basophils Absolute 0.1 0.0 - 0.1 K/uL   Immature Granulocytes 0 %   Abs Immature Granulocytes 0.02 0.00 - 0.07 K/uL    Comment: Performed at [facility], [address] [place], [place] [address]  Urinalysis, Routine w reflex microscopic -Urine, Clean Catch     Status: None   Collection Time: 05/25/22  6:17 AM  Result Value Ref Range   Color, Urine YELLOW YELLOW   APPearance CLEAR CLEAR   Specific Gravity, Urine 1.016 1.005 - 1.030   pH 6.0 5.0 - 8.0   Glucose, UA NEGATIVE NEGATIVE mg/dL   Hgb urine dipstick NEGATIVE NEGATIVE   Bilirubin Urine NEGATIVE NEGATIVE   Ketones, ur NEGATIVE NEGATIVE mg/dL   Protein, ur NEGATIVE NEGATIVE mg/dL   Nitrite NEGATIVE NEGATIVE   Leukocytes,Ua NEGATIVE NEGATIVE    Comment: Performed at [facility] [address], [place], [place] [address]  Pregnancy, urine     Status: None   Collection Time: 05/25/22  6:17 AM  Result Value Ref Range   Preg Test, Ur NEGATIVE NEGATIVE    Comment:        THE SENSITIVITY OF THIS METHODOLOGY IS >20 mIU/mL. Performed at [facility], [place] [address] [place], [place] [address]    US Abdomen  Limited RUQ (LIVER/GB)  Result Date: 05/25/2022 CLINICAL DATA:  Right upper quadrant pain EXAM: ULTRASOUND ABDOMEN LIMITED RIGHT UPPER QUADRANT COMPARISON:  05/23/2022 FINDINGS: Gallbladder: Multiple small mobile gallstones layering within the gallbladder measuring up to 6 mm in diameter. No wall thickening or pericholecystic  fluid visualized. No sonographic Murphy sign noted by sonographer. Common bile duct: Diameter: 3.5 mm. Liver: No focal lesion identified. Mildly increased hepatic parenchymal echogenicity. Portal vein is patent on color Doppler imaging with normal direction of blood flow towards the liver. Other: None. IMPRESSION: 1. Cholelithiasis without sonographic evidence of acute cholecystitis. 2. The echogenicity of the liver is increased. This is a nonspecific finding but is most commonly seen with fatty infiltration of the liver. There are no obvious focal liver lesions. Electronically Signed   By: Davina Poke D.O.   On: 05/25/2022 08:39    Anti-infectives (From admission, onward)    None       Assessment/Plan Symptomatic Cholelithiasis with possible early Acute Cholecystitis  - W/u consistent with above. Recommend Laparoscopic Cholecystectomy. I have explained the procedure, risks, and aftercare of Laparoscopic cholecystectomy.  Risks include but are not limited to anesthesia (MI, CVA, death, prolonged intubation and aspiration), bleeding, infection, wound problems, hernia, bile leak, injury to common bile duct/liver/intestine, possible need for subtotal cholecystectomy or open cholecystectomy, increased risk of DVT/PE and diarrhea post op.  She seems to understand and agrees to proceed. If able to accomidate surgery today, possible d/c from pacu. If unable to accommodate surgery today, will plan admission to observation with surgery tomorrow.   FEN - NPO VTE - SCDs ID - Cipro (pcn allergy)   I reviewed nursing notes, ED provider notes, last 24 h vitals and pain scores, last 48  h intake and output, last 24 h labs and trends, and last 24 h imaging results.  Jillyn Ledger, [facility] Surgery 05/25/2022, 8:57 AM Please see Amion for pager number during day hours 7:00am-4:30pm

## 2022-05-25 NOTE — ED Provider Notes (Signed)
[facility] Provider Note   CSN: [id number] Arrival date & time: 05/25/22  0602     History  Chief Complaint  Patient presents with   Abdominal Pain    Sonya Jackson is a 32 y.o. female.  Patient is a 32 year old female about 4 weeks postpartum presenting to the emergency department with abdominal pain.  The patient was seen in the ED 2 days ago with abdominal pain and was diagnosed with cholelithiasis.  She states that she first had the pain about 5 weeks ago prior to giving birth but wanted to wait to have surgery until it was able to be scheduled at a later time.  The patient states that she tried to manage her pain at home to schedule an outpatient surgery however she states that she has continued to have "gallbladder attacks".  She states that she has had a constant ache in her right upper quadrant for the last day and then will have intermittent severe pain that comes and goes.  She denies any fevers or chills, nausea or vomiting, diarrhea or constipation.  She denies any other abdominal surgeries in the past.  The history is provided by the patient.  Abdominal Pain      Home Medications Prior to Admission medications   Medication Sig Start Date End Date Taking? Authorizing Provider  ondansetron (ZOFRAN) 4 MG tablet Take 1 tablet (4 mg total) by mouth every 6 (six) hours. 05/23/22   Marcello Fennel, PA-C  oxyCODONE-acetaminophen (PERCOCET/ROXICET) 5-325 MG tablet Take 1 tablet by mouth every 8 (eight) hours as needed for up to 4 days for severe pain. 05/23/22 05/27/22  Marcello Fennel, PA-C      Allergies    Shellfish allergy, Ibuprofen, and Penicillins    Review of Systems   Review of Systems  Gastrointestinal:  Positive for abdominal pain.    Physical Exam Updated Vital Signs BP 115/79   Pulse 71   Temp 98 F (36.7 C) (Oral)   Resp 17   Ht '5\' 1"'$  (1.549 m)   Wt 104.3 kg   SpO2 96%   BMI 43.46 kg/m  Physical  Exam Vitals and nursing note reviewed.  Constitutional:      General: She is not in acute distress.    Appearance: She is well-developed.  HENT:     Head: Normocephalic and atraumatic.     Mouth/Throat:     Mouth: Mucous membranes are moist.     Pharynx: Oropharynx is clear.  Eyes:     Extraocular Movements: Extraocular movements intact.  Cardiovascular:     Rate and Rhythm: Normal rate and regular rhythm.  Pulmonary:     Effort: Pulmonary effort is normal.     Breath sounds: Normal breath sounds.  Abdominal:     General: Abdomen is flat.     Palpations: Abdomen is soft.     Tenderness: There is abdominal tenderness (minimal) in the right upper quadrant. There is no guarding or rebound.  Skin:    General: Skin is warm and dry.  Neurological:     General: No focal deficit present.     Mental Status: She is alert and oriented to person, place, and time.  Psychiatric:        Mood and Affect: Mood normal.        Behavior: Behavior normal.     ED Results / Procedures / Treatments   Labs (all labs ordered are listed, but only abnormal  results are displayed) Labs Reviewed  COMPREHENSIVE METABOLIC PANEL - Abnormal; Notable for the following components:      Result Value   Albumin 3.4 (*)    AST 217 (*)    ALT 248 (*)    All other components within normal limits  LIPASE, BLOOD  URINALYSIS, ROUTINE W REFLEX MICROSCOPIC  CBC WITH DIFFERENTIAL/PLATELET  PREGNANCY, URINE    EKG None  Radiology US Abdomen Limited RUQ (LIVER/GB)  Result Date: 05/25/2022 CLINICAL DATA:  Right upper quadrant pain EXAM: ULTRASOUND ABDOMEN LIMITED RIGHT UPPER QUADRANT COMPARISON:  05/23/2022 FINDINGS: Gallbladder: Multiple small mobile gallstones layering within the gallbladder measuring up to 6 mm in diameter. No wall thickening or pericholecystic fluid visualized. No sonographic Murphy sign noted by sonographer. Common bile duct: Diameter: 3.5 mm. Liver: No focal lesion identified. Mildly  increased hepatic parenchymal echogenicity. Portal vein is patent on color Doppler imaging with normal direction of blood flow towards the liver. Other: None. IMPRESSION: 1. Cholelithiasis without sonographic evidence of acute cholecystitis. 2. The echogenicity of the liver is increased. This is a nonspecific finding but is most commonly seen with fatty infiltration of the liver. There are no obvious focal liver lesions. Electronically Signed   By: Davina Poke D.O.   On: 05/25/2022 08:39    Procedures Procedures    Medications Ordered in ED Medications - No data to display  ED Course/ Medical Decision Making/ A&P Clinical Course as of 05/25/22 0851  Tue May 25, 2022  0749 I spoke with Alferd Apa PA with general surgery who requested repeat US with uptrending LFTs and he will evaluate the patient at bedside. [VK]  [id number] Repeat US with normal appearing CBD, cholelithiasis without cholecystitis. General surgery plans to admit for OR. [VK]    Clinical Course User Index [VK] Kemper Durie, DO                             Medical Decision Making This patient presents to the ED with chief complaint(s) of abdominal pain with pertinent past medical history of 4 weeks post-partum, recently diagnosed cholelithiasis which further complicates the presenting complaint. The complaint involves an extensive differential diagnosis and also carries with it a high risk of complications and morbidity.    The differential diagnosis includes cholelithiasis, cholecystitis, choledocholithiasis, hepatitis, pancreatitis, dehydration, electrolyte abnormality, no urinary symptoms making UTI unlikely  Additional history obtained: Additional history obtained from N/A Records reviewed previous admission documents  ED Course and Reassessment: Patient was initially evaluated in triage and had labs and urine performed.  The patient's urine showed negative pregnancy test and no signs of UTI.  Her  leukocytosis have resolved.  Remainder of her labs are pending at this time.  She did last have imaging done 2 days ago that showed cholelithiasis with gallbladder sludge and no evidence of that time of cholecystitis.  The patient states that her pain is currently controlled.  Plan will be to reach out to surgery after labs result for likely surgical management of her cholelithiasis.  Independent labs interpretation:  The following labs were independently interpreted: increasing transaminitis, otherwise labs within normal rnage  Independent visualization of imaging: - I independently visualized the following imaging with scope of interpretation limited to determining acute life threatening conditions related to emergency care: RUQ [place], which revealed cholelithiasis w/o cholecystitis, no biliary obstruction  Consultation: - Consulted or discussed management/test interpretation w/ external professional: general surgery  Consideration for admission or  further workup: patient requires admission for symptomatic cholelithiasis Social Determinants of health: N/A    Amount and/or Complexity of Data Reviewed Labs: ordered. Radiology: ordered.  Risk Decision regarding hospitalization.          Final Clinical Impression(s) / ED Diagnoses Final diagnoses:  Calculus of gallbladder without cholecystitis without obstruction    Rx / DC Orders ED Discharge Orders     None         Kemper Durie, DO 05/25/22 [phone]

## 2022-05-25 NOTE — Discharge Instructions (Signed)
[facility]  Please arrive at least 30 min before your appointment to complete your check in paperwork.  If you are unable to arrive 30 min prior to your appointment time we may have to cancel or reschedule you. LAPAROSCOPIC SURGERY: POST OP INSTRUCTIONS Always review your discharge instruction sheet given to you by the facility where your surgery was performed. IF YOU HAVE DISABILITY OR FAMILY LEAVE FORMS, YOU MUST BRING THEM TO THE OFFICE FOR PROCESSING.   DO NOT GIVE THEM TO YOUR DOCTOR.  PAIN CONTROL  First take acetaminophen (Tylenol) AND/or ibuprofen (Advil) to control your pain after surgery.  Follow directions on package.  Taking acetaminophen (Tylenol) and/or ibuprofen (Advil) regularly after surgery will help to control your pain and lower the amount of prescription pain medication you may need.  You should not take more than 4,000 mg (4 grams) of acetaminophen (Tylenol) in 24 hours.  You should not take ibuprofen (Advil), aleve, motrin, naprosyn or other NSAIDS if you have a history of stomach ulcers or chronic kidney disease.  A prescription for pain medication may be given to you upon discharge.  Take your pain medication as prescribed, if you still have uncontrolled pain after taking acetaminophen (Tylenol) or ibuprofen (Advil). Use ice packs to help control pain. If you need a refill on your pain medication, please contact your pharmacy.  They will contact our office to request authorization. Prescriptions will not be filled after 5pm or on week-ends.  HOME MEDICATIONS Take your usually prescribed medications unless otherwise directed.  DIET You should follow a light diet the first few days after arrival home.  Be sure to include lots of fluids daily. Avoid fatty, fried foods.   CONSTIPATION It is common to experience some constipation after surgery and if you are taking pain medication.  Increasing fluid intake and taking a stool softener (such as Colace)  will usually help or prevent this problem from occurring.  A mild laxative (Milk of Magnesia or Miralax) should be taken according to package instructions if there are no bowel movements after 48 hours.  WOUND/INCISION CARE Most patients will experience some swelling and bruising in the area of the incisions.  Ice packs will help.  Swelling and bruising can take several days to resolve.  Unless discharge instructions indicate otherwise, follow guidelines below  STERI-STRIPS - you may remove your outer bandages 48 hours after surgery, and you may shower at that time.  You have steri-strips (small skin tapes) in place directly over the incision.  These strips should be left on the skin for 7-10 days.   DERMABOND/SKIN GLUE - you may shower in 24 hours.  The glue will flake off over the next 2-3 weeks. Any sutures or staples will be removed at the office during your follow-up visit.  ACTIVITIES You may resume regular (light) daily activities beginning the next day--such as daily self-care, walking, climbing stairs--gradually increasing activities as tolerated.  You may have sexual intercourse when it is comfortable.  Refrain from any heavy lifting or straining until approved by your doctor. You may drive when you are no longer taking prescription pain medication, you can comfortably wear a seatbelt, and you can safely maneuver your car and apply brakes.  FOLLOW-UP You should see your doctor in the office for a follow-up appointment approximately 2-3 weeks after your surgery.  You should have been given your post-op/follow-up appointment when your surgery was scheduled.  If you did not receive a post-op/follow-up appointment, make sure   that you call for this appointment within a day or two after you arrive home to insure a convenient appointment time.   WHEN TO CALL YOUR DOCTOR: Fever over 101.0 Inability to urinate Continued bleeding from incision. Increased pain, redness, or drainage from the  incision. Increasing abdominal pain  The clinic staff is available to answer your questions during regular business hours.  Please don't hesitate to call and ask to speak to one of the nurses for clinical concerns.  If you have a medical emergency, go to the nearest emergency room or call 911.  A surgeon from [facility] is always on call at the hospital. [address], [address], [place], [place]  [address] ? [address] [address], [place], [place]   [address] [phone] ? [phone] ? FAX [phone]     Managing Your Pain After Surgery Without Opioids    Thank you for participating in our program to help patients manage their pain after surgery without opioids. This is part of our effort to provide you with the best care possible, without exposing you or your family to the risk that opioids pose.  What pain can I expect after surgery? You can expect to have some pain after surgery. This is normal. The pain is typically worse the day after surgery, and quickly begins to get better. Many studies have found that many patients are able to manage their pain after surgery with Over-the-Counter (OTC) medications such as Tylenol and Motrin. If you have a condition that does not allow you to take Tylenol or Motrin, notify your surgical team.  How will I manage my pain? The best strategy for controlling your pain after surgery is around the clock pain control with Tylenol (acetaminophen) and Motrin (ibuprofen or Advil). Alternating these medications with each other allows you to maximize your pain control. In addition to Tylenol and Motrin, you can use heating pads or ice packs on your incisions to help reduce your pain.  How will I alternate your regular strength over-the-counter pain medication? You will take a dose of pain medication every three hours. Start by taking 650 mg of Tylenol (2 pills of 325 mg) 3 hours later take 600 mg of Motrin (3 pills of 200 mg) 3 hours after  taking the Motrin take 650 mg of Tylenol 3 hours after that take 600 mg of Motrin.   - 1 -  See example - if your first dose of Tylenol is at 12:00 PM   12:00 PM Tylenol 650 mg (2 pills of 325 mg)  3:00 PM Motrin 600 mg (3 pills of 200 mg)  6:00 PM Tylenol 650 mg (2 pills of 325 mg)  9:00 PM Motrin 600 mg (3 pills of 200 mg)  Continue alternating every 3 hours   We recommend that you follow this schedule around-the-clock for at least 3 days after surgery, or until you feel that it is no longer needed. Use the table on the last page of this handout to keep track of the medications you are taking. Important: Do not take more than 3000mg of Tylenol or 3200mg of Motrin in a 24-hour period. Do not take ibuprofen/Motrin if you have a history of bleeding stomach ulcers, severe kidney disease, &/or actively taking a blood thinner  What if I still have pain? If you have pain that is not controlled with the over-the-counter pain medications (Tylenol and Motrin or Advil) you might have what we call "breakthrough" pain. You will receive a prescription   for a small amount of an opioid pain medication such as Oxycodone, Tramadol, or Tylenol with Codeine. Use these opioid pills in the first 24 hours after surgery if you have breakthrough pain. Do not take more than 1 pill every 4-6 hours.  If you still have uncontrolled pain after using all opioid pills, don't hesitate to call our staff using the number provided. We will help make sure you are managing your pain in the best way possible, and if necessary, we can provide a prescription for additional pain medication.   Day 1    Time  Name of Medication Number of pills taken  Amount of Acetaminophen  Pain Level   Comments  AM PM       AM PM       AM PM       AM PM       AM PM       AM PM       AM PM       AM PM       Total Daily amount of Acetaminophen Do not take more than  3,000 mg per day      Day 2    Time  Name of Medication  Number of pills taken  Amount of Acetaminophen  Pain Level   Comments  AM PM       AM PM       AM PM       AM PM       AM PM       AM PM       AM PM       AM PM       Total Daily amount of Acetaminophen Do not take more than  3,000 mg per day      Day 3    Time  Name of Medication Number of pills taken  Amount of Acetaminophen  Pain Level   Comments  AM PM       AM PM       AM PM       AM PM         AM PM       AM PM       AM PM       AM PM       Total Daily amount of Acetaminophen Do not take more than  3,000 mg per day      Day 4    Time  Name of Medication Number of pills taken  Amount of Acetaminophen  Pain Level   Comments  AM PM       AM PM       AM PM       AM PM       AM PM       AM PM       AM PM       AM PM       Total Daily amount of Acetaminophen Do not take more than  3,000 mg per day      Day 5    Time  Name of Medication Number of pills taken  Amount of Acetaminophen  Pain Level   Comments  AM PM       AM PM       AM PM       AM PM       AM PM       AM PM         AM PM       AM PM       Total Daily amount of Acetaminophen Do not take more than  3,000 mg per day      Day 6    Time  Name of Medication Number of pills taken  Amount of Acetaminophen  Pain Level  Comments  AM PM       AM PM       AM PM       AM PM       AM PM       AM PM       AM PM       AM PM       Total Daily amount of Acetaminophen Do not take more than  3,000 mg per day      Day 7    Time  Name of Medication Number of pills taken  Amount of Acetaminophen  Pain Level   Comments  AM PM       AM PM       AM PM       AM PM       AM PM       AM PM       AM PM       AM PM       Total Daily amount of Acetaminophen Do not take more than  3,000 mg per day        For additional information about how and where to safely dispose of unused opioid medications - https://www.morepowerfulnc.org  Disclaimer: This document contains  information and/or instructional materials adapted from [facility] for the typical patient with your condition. It does not replace medical advice from your health care provider because your experience may differ from that of the typical patient. Talk to your health care provider if you have any questions about this document, your condition or your treatment plan. Adapted from [facility]  

## 2022-05-25 NOTE — ED Triage Notes (Signed)
Returns for ongoing RUQ abdominal pain. [place] on 3/2 shows sludge and wall thickening.   Says they were going to admit her and take out gallbladder but she was concerned with child at home. Setup to complete outpatient if able to tolerate.  Around 1am today pain became unbearable and constant. Was instructed to return for worsening symptoms and possibility of surgery.

## 2022-05-25 NOTE — Op Note (Signed)
Laparoscopic Cholecystectomy Procedure Note  Indications: This patient presents with symptomatic gallbladder disease and will undergo laparoscopic cholecystectomy.  Pre-operative Diagnosis: Symptomatic cholelithiasis  Post-operative Diagnosis: Same  Surgeon: Coralie Keens   Assistants: none  Anesthesia: General endotracheal anesthesia  ASA Class: 1  Procedure Details  The patient was seen again in the Holding Room. The risks, benefits, complications, treatment options, and expected outcomes were discussed with the patient. The possibilities of reaction to medication, pulmonary aspiration, perforation of viscus, bleeding, recurrent infection, finding a normal gallbladder, the need for additional procedures, failure to diagnose a condition, the possible need to convert to an open procedure, and creating a complication requiring transfusion or operation were discussed with the patient. The likelihood of improving the patient's symptoms with return to their baseline status is good.  The patient and/or family concurred with the proposed plan, giving informed consent. The site of surgery properly noted. The patient was taken to Operating Room, identified as Sonya Jackson and the procedure verified as Laparoscopic Cholecystectomy with Intraoperative Cholangiogram. A Time Out was held and the above information confirmed.  Prior to the induction of general anesthesia, antibiotic prophylaxis was administered. General endotracheal anesthesia was then administered and tolerated well. After the induction, the abdomen was prepped with Chloraprep and draped in sterile fashion. The patient was positioned in the supine position.  Local anesthetic agent was injected into the skin near the umbilicus and an incision made. We dissected down to the abdominal fascia with blunt dissection.  The fascia was incised vertically and we entered the peritoneal cavity bluntly.  A pursestring suture of 0-Vicryl was placed  around the fascial opening.  The Hasson cannula was inserted and secured with the stay suture.  Pneumoperitoneum was then created with CO2 and tolerated well without any adverse changes in the patient's vital signs. A 5-mm port was placed in the subxiphoid position.  Two 5-mm ports were placed in the right upper quadrant. All skin incisions were infiltrated with a local anesthetic agent before making the incision and placing the trocars.   We positioned the patient in reverse Trendelenburg, tilted slightly to the patient's left.  The gallbladder was identified, the fundus grasped and retracted cephalad. Adhesions were lysed bluntly and with the electrocautery where indicated, taking care not to injure any adjacent organs or viscus. The infundibulum was grasped and retracted laterally, exposing the peritoneum overlying the triangle of Calot. This was then divided and exposed in a blunt fashion. The cystic duct was clearly identified and bluntly dissected circumferentially. A critical view of the cystic duct and cystic artery was obtained.  The cystic duct was then ligated with clips and divided. The cystic artery was, dissected free, ligated with clips and divided as well.   The gallbladder was dissected from the liver bed in retrograde fashion with the electrocautery. The gallbladder was removed and placed in an Endocatch sac. The liver bed was irrigated and inspected. Hemostasis was achieved with the electrocautery. Copious irrigation was utilized and was repeatedly aspirated until clear.  The gallbladder and Endocatch sac were then removed through the umbilical port site.  The pursestring suture was used to close the umbilical fascia.    We again inspected the right upper quadrant for hemostasis.  Pneumoperitoneum was released as we removed the trocars.  4-0 Monocryl was used to close the skin.   Skin glue was then applied. The patient was then extubated and brought to the recovery room in stable condition.  Instrument, sponge, and needle counts were  correct at closure and at the conclusion of the case.   Findings: Mild chronic Cholecystitis with Cholelithiasis  Estimated Blood Loss: Minimal         Drains: none         Specimens: Gallbladder           Complications: None; patient tolerated the procedure well.         Disposition: PACU - hemodynamically stable.         Condition: stable

## 2022-05-25 NOTE — Transfer of Care (Signed)
Immediate Anesthesia Transfer of Care Note  Patient: Sonya Jackson  Procedure(s) Performed: LAPAROSCOPIC CHOLECYSTECTOMY (Abdomen)  Patient Location: PACU  Anesthesia Type:General  Level of Consciousness: drowsy and patient cooperative  Airway & Oxygen Therapy: Patient Spontanous Breathing and Patient connected to face mask oxygen  Post-op Assessment: Report given to RN and Post -op Vital signs reviewed and stable  Post vital signs: Reviewed and stable  Last Vitals:  Vitals Value Taken Time  BP 138/85 05/25/22 1505  Temp 36.7 C 05/25/22 1505  Pulse 62 05/25/22 1514  Resp 20 05/25/22 1514  SpO2 100 % 05/25/22 1514  Vitals shown include unvalidated device data.  Last Pain:  Vitals:   05/25/22 1505  TempSrc:   PainSc: Asleep      Patients Stated Pain Goal: 0 ([id number])  Complications: No notable events documented.

## 2022-05-25 NOTE — Anesthesia Preprocedure Evaluation (Signed)
Anesthesia Evaluation  Patient identified by MRN, date of birth, ID band Patient awake    Reviewed: Allergy & Precautions, NPO status , Patient's Chart, lab work & pertinent test results  Airway Mallampati: II       Dental no notable dental hx.    Pulmonary    Pulmonary exam normal        Cardiovascular Normal cardiovascular exam     Neuro/Psych negative neurological ROS  negative psych ROS   GI/Hepatic negative GI ROS, Neg liver ROS,,,  Endo/Other  negative endocrine ROS  Morbid obesity  Renal/GU negative Renal ROS  negative genitourinary   Musculoskeletal negative musculoskeletal ROS (+)    Abdominal  (+) + obese  Peds  Hematology   Anesthesia Other Findings   Reproductive/Obstetrics                             Anesthesia Physical Anesthesia Plan  ASA: 3  Anesthesia Plan: General   Post-op Pain Management:    Induction:   PONV Risk Score and Plan: 4 or greater and Ondansetron, Dexamethasone and Midazolam  Airway Management Planned: Oral ETT  Additional Equipment: None  Intra-op Plan:   Post-operative Plan: Extubation in OR  Informed Consent: I have reviewed the patients History and Physical, chart, labs and discussed the procedure including the risks, benefits and alternatives for the proposed anesthesia with the patient or authorized representative who has indicated his/her understanding and acceptance.     Dental advisory given  Plan Discussed with: CRNA  Anesthesia Plan Comments:        Anesthesia Quick Evaluation

## 2022-05-26 ENCOUNTER — Encounter (HOSPITAL_COMMUNITY): Payer: Self-pay | Admitting: Surgery

## 2022-05-26 NOTE — Anesthesia Postprocedure Evaluation (Signed)
Anesthesia Post Note  Patient: Sonya Jackson  Procedure(s) Performed: LAPAROSCOPIC CHOLECYSTECTOMY (Abdomen)     Patient location during evaluation: PACU Anesthesia Type: General Level of consciousness: awake Pain management: pain level controlled Vital Signs Assessment: post-procedure vital signs reviewed and stable Respiratory status: spontaneous breathing Cardiovascular status: stable Postop Assessment: no apparent nausea or vomiting Anesthetic complications: no  No notable events documented.  Last Vitals:  Vitals:   05/25/22 1535 05/25/22 1550  BP: 115/69 103/68  Pulse: 69 60  Resp: 10 15  Temp:  36.7 C  SpO2: 100% 95%    Last Pain:  Vitals:   05/25/22 1550  TempSrc:   PainSc: 3                  [name]

## 2022-05-27 LAB — SURGICAL PATHOLOGY
